# Patient Record
Sex: Female | Born: 1945 | Race: Asian | Hispanic: No | Marital: Married | Smoking: Never smoker
Health system: Southern US, Community
[De-identification: ages and names within clinical notes are randomized; demographics above are authoritative.]

## PROBLEM LIST (undated history)

## (undated) DIAGNOSIS — I10 Essential (primary) hypertension: Secondary | ICD-10-CM

## (undated) DIAGNOSIS — E7800 Pure hypercholesterolemia, unspecified: Secondary | ICD-10-CM

## (undated) DIAGNOSIS — I2510 Atherosclerotic heart disease of native coronary artery without angina pectoris: Secondary | ICD-10-CM

## (undated) DIAGNOSIS — K219 Gastro-esophageal reflux disease without esophagitis: Secondary | ICD-10-CM

## (undated) HISTORY — PX: GALLBLADDER SURGERY: SHX652

## (undated) HISTORY — DX: Atherosclerotic heart disease of native coronary artery without angina pectoris: I25.10

---

## 1998-12-14 ENCOUNTER — Encounter: Admission: RE | Admit: 1998-12-14 | Discharge: 1998-12-14 | Payer: Self-pay | Admitting: Family Medicine

## 2000-05-13 ENCOUNTER — Other Ambulatory Visit: Admission: RE | Admit: 2000-05-13 | Discharge: 2000-05-13 | Payer: Self-pay | Admitting: Family Medicine

## 2000-06-02 ENCOUNTER — Encounter: Admission: RE | Admit: 2000-06-02 | Discharge: 2000-06-02 | Payer: Self-pay | Admitting: Family Medicine

## 2000-06-02 ENCOUNTER — Encounter: Payer: Self-pay | Admitting: Family Medicine

## 2001-03-09 ENCOUNTER — Encounter: Payer: Self-pay | Admitting: Family Medicine

## 2001-03-09 ENCOUNTER — Encounter: Admission: RE | Admit: 2001-03-09 | Discharge: 2001-03-09 | Payer: Self-pay | Admitting: Family Medicine

## 2001-08-26 ENCOUNTER — Ambulatory Visit (HOSPITAL_COMMUNITY): Admission: RE | Admit: 2001-08-26 | Discharge: 2001-08-26 | Payer: Self-pay | Admitting: Family Medicine

## 2001-08-26 ENCOUNTER — Encounter: Payer: Self-pay | Admitting: Family Medicine

## 2001-09-01 ENCOUNTER — Other Ambulatory Visit: Admission: RE | Admit: 2001-09-01 | Discharge: 2001-09-01 | Payer: Self-pay | Admitting: Family Medicine

## 2001-09-07 ENCOUNTER — Encounter: Payer: Self-pay | Admitting: Family Medicine

## 2001-09-07 ENCOUNTER — Encounter: Admission: RE | Admit: 2001-09-07 | Discharge: 2001-09-07 | Payer: Self-pay | Admitting: Family Medicine

## 2001-09-18 ENCOUNTER — Encounter (INDEPENDENT_AMBULATORY_CARE_PROVIDER_SITE_OTHER): Payer: Self-pay | Admitting: Specialist

## 2001-09-18 ENCOUNTER — Encounter: Payer: Self-pay | Admitting: Surgery

## 2001-09-18 ENCOUNTER — Observation Stay (HOSPITAL_COMMUNITY): Admission: RE | Admit: 2001-09-18 | Discharge: 2001-09-19 | Payer: Self-pay | Admitting: Surgery

## 2002-09-09 ENCOUNTER — Encounter: Admission: RE | Admit: 2002-09-09 | Discharge: 2002-09-09 | Payer: Self-pay | Admitting: Family Medicine

## 2002-09-09 ENCOUNTER — Other Ambulatory Visit: Admission: RE | Admit: 2002-09-09 | Discharge: 2002-09-09 | Payer: Self-pay | Admitting: Family Medicine

## 2002-09-09 ENCOUNTER — Encounter: Payer: Self-pay | Admitting: Family Medicine

## 2003-11-04 ENCOUNTER — Other Ambulatory Visit: Admission: RE | Admit: 2003-11-04 | Discharge: 2003-11-04 | Payer: Self-pay | Admitting: Family Medicine

## 2003-11-18 ENCOUNTER — Encounter: Admission: RE | Admit: 2003-11-18 | Discharge: 2003-11-18 | Payer: Self-pay | Admitting: Family Medicine

## 2005-09-03 ENCOUNTER — Ambulatory Visit: Payer: Self-pay | Admitting: Family Medicine

## 2005-09-05 ENCOUNTER — Ambulatory Visit (HOSPITAL_COMMUNITY): Admission: RE | Admit: 2005-09-05 | Discharge: 2005-09-05 | Payer: Self-pay | Admitting: Family Medicine

## 2005-10-07 ENCOUNTER — Ambulatory Visit: Payer: Self-pay | Admitting: *Deleted

## 2005-10-10 ENCOUNTER — Ambulatory Visit: Payer: Self-pay | Admitting: Family Medicine

## 2005-10-11 ENCOUNTER — Encounter (INDEPENDENT_AMBULATORY_CARE_PROVIDER_SITE_OTHER): Payer: Self-pay | Admitting: Family Medicine

## 2005-10-11 LAB — CONVERTED CEMR LAB: TSH: 1.951 microintl units/mL

## 2005-11-05 ENCOUNTER — Ambulatory Visit: Payer: Self-pay | Admitting: Family Medicine

## 2005-11-11 ENCOUNTER — Ambulatory Visit: Payer: Self-pay | Admitting: Family Medicine

## 2005-11-15 ENCOUNTER — Ambulatory Visit (HOSPITAL_COMMUNITY): Admission: RE | Admit: 2005-11-15 | Discharge: 2005-11-15 | Payer: Self-pay | Admitting: Family Medicine

## 2005-12-03 ENCOUNTER — Ambulatory Visit: Payer: Self-pay | Admitting: Family Medicine

## 2005-12-11 ENCOUNTER — Ambulatory Visit (HOSPITAL_COMMUNITY): Admission: RE | Admit: 2005-12-11 | Discharge: 2005-12-11 | Payer: Self-pay | Admitting: Family Medicine

## 2005-12-24 ENCOUNTER — Ambulatory Visit: Payer: Self-pay | Admitting: Family Medicine

## 2006-01-06 ENCOUNTER — Ambulatory Visit: Payer: Self-pay | Admitting: Family Medicine

## 2006-02-03 ENCOUNTER — Encounter (INDEPENDENT_AMBULATORY_CARE_PROVIDER_SITE_OTHER): Payer: Self-pay | Admitting: Family Medicine

## 2006-02-03 ENCOUNTER — Ambulatory Visit: Payer: Self-pay | Admitting: Family Medicine

## 2006-02-04 ENCOUNTER — Encounter (INDEPENDENT_AMBULATORY_CARE_PROVIDER_SITE_OTHER): Payer: Self-pay | Admitting: Family Medicine

## 2006-03-18 ENCOUNTER — Ambulatory Visit: Payer: Self-pay | Admitting: Family Medicine

## 2006-10-21 ENCOUNTER — Encounter (INDEPENDENT_AMBULATORY_CARE_PROVIDER_SITE_OTHER): Payer: Self-pay | Admitting: Family Medicine

## 2006-10-21 DIAGNOSIS — K649 Unspecified hemorrhoids: Secondary | ICD-10-CM | POA: Insufficient documentation

## 2006-10-21 DIAGNOSIS — I10 Essential (primary) hypertension: Secondary | ICD-10-CM | POA: Insufficient documentation

## 2006-10-21 DIAGNOSIS — N951 Menopausal and female climacteric states: Secondary | ICD-10-CM | POA: Insufficient documentation

## 2006-10-21 DIAGNOSIS — E7800 Pure hypercholesterolemia, unspecified: Secondary | ICD-10-CM | POA: Insufficient documentation

## 2006-10-22 DIAGNOSIS — K5730 Diverticulosis of large intestine without perforation or abscess without bleeding: Secondary | ICD-10-CM | POA: Insufficient documentation

## 2006-11-19 ENCOUNTER — Encounter (INDEPENDENT_AMBULATORY_CARE_PROVIDER_SITE_OTHER): Payer: Self-pay | Admitting: *Deleted

## 2008-02-19 ENCOUNTER — Ambulatory Visit: Payer: Self-pay | Admitting: Family Medicine

## 2008-02-19 LAB — CONVERTED CEMR LAB
ALT: 20 units/L (ref 0–35)
Albumin: 4.7 g/dL (ref 3.5–5.2)
Basophils Relative: 1 % (ref 0–1)
Hemoglobin: 14.4 g/dL (ref 12.0–15.0)
LDL Cholesterol: 143 mg/dL — ABNORMAL HIGH (ref 0–99)
Lymphocytes Relative: 32 % (ref 12–46)
Monocytes Absolute: 0.3 10*3/uL (ref 0.1–1.0)
Neutro Abs: 2.5 10*3/uL (ref 1.7–7.7)
Neutrophils Relative %: 58 % (ref 43–77)
Platelets: 229 10*3/uL (ref 150–400)
Potassium: 4.2 meq/L (ref 3.5–5.3)
RDW: 12.4 % (ref 11.5–15.5)
Sodium: 144 meq/L (ref 135–145)
TSH: 3.6 microintl units/mL (ref 0.350–4.50)
WBC: 4.3 10*3/uL (ref 4.0–10.5)

## 2008-03-03 ENCOUNTER — Ambulatory Visit (HOSPITAL_COMMUNITY): Admission: RE | Admit: 2008-03-03 | Discharge: 2008-03-03 | Payer: Self-pay | Admitting: Family Medicine

## 2008-03-11 ENCOUNTER — Ambulatory Visit (HOSPITAL_COMMUNITY): Admission: RE | Admit: 2008-03-11 | Discharge: 2008-03-11 | Payer: Self-pay | Admitting: Family Medicine

## 2008-09-09 ENCOUNTER — Ambulatory Visit: Payer: Self-pay | Admitting: Family Medicine

## 2008-09-13 ENCOUNTER — Ambulatory Visit: Payer: Self-pay | Admitting: Internal Medicine

## 2008-09-14 ENCOUNTER — Ambulatory Visit: Payer: Self-pay | Admitting: Internal Medicine

## 2008-09-14 ENCOUNTER — Ambulatory Visit (HOSPITAL_COMMUNITY): Admission: RE | Admit: 2008-09-14 | Discharge: 2008-09-14 | Payer: Self-pay | Admitting: Family Medicine

## 2008-10-07 ENCOUNTER — Ambulatory Visit: Payer: Self-pay | Admitting: Internal Medicine

## 2008-11-08 ENCOUNTER — Ambulatory Visit: Payer: Self-pay | Admitting: Family Medicine

## 2009-01-03 ENCOUNTER — Ambulatory Visit: Payer: Self-pay | Admitting: Family Medicine

## 2009-01-03 LAB — CONVERTED CEMR LAB
ALT: 18 units/L (ref 0–35)
Albumin: 4.5 g/dL (ref 3.5–5.2)
Alkaline Phosphatase: 77 units/L (ref 39–117)
CO2: 24 meq/L (ref 19–32)
Calcium: 9.3 mg/dL (ref 8.4–10.5)
Cholesterol: 245 mg/dL — ABNORMAL HIGH (ref 0–200)
Creatinine, Ser: 0.81 mg/dL (ref 0.40–1.20)
Glucose, Bld: 94 mg/dL (ref 70–99)
HDL: 77 mg/dL (ref >39)
Sodium: 142 meq/L (ref 135–145)
Total CHOL/HDL Ratio: 3.2
Total Protein: 7.2 g/dL (ref 6.0–8.3)
Triglycerides: 128 mg/dL (ref <150)
VLDL: 26 mg/dL (ref 0–40)

## 2009-08-08 ENCOUNTER — Ambulatory Visit: Payer: Self-pay | Admitting: Family Medicine

## 2009-08-08 LAB — CONVERTED CEMR LAB
ALT: 16 units/L (ref 0–35)
Cholesterol: 242 mg/dL — ABNORMAL HIGH (ref 0–200)
HDL: 71 mg/dL (ref >39)
LDL Cholesterol: 147 mg/dL — ABNORMAL HIGH (ref 0–99)
Total CHOL/HDL Ratio: 3.4
Triglycerides: 118 mg/dL (ref <150)

## 2009-08-17 ENCOUNTER — Ambulatory Visit (HOSPITAL_COMMUNITY): Admission: RE | Admit: 2009-08-17 | Discharge: 2009-08-17 | Payer: Self-pay | Admitting: Family Medicine

## 2009-10-31 ENCOUNTER — Ambulatory Visit: Payer: Self-pay | Admitting: Family Medicine

## 2009-11-14 ENCOUNTER — Encounter (INDEPENDENT_AMBULATORY_CARE_PROVIDER_SITE_OTHER): Payer: Self-pay | Admitting: Family Medicine

## 2009-11-14 ENCOUNTER — Ambulatory Visit: Payer: Self-pay | Admitting: Internal Medicine

## 2009-11-14 LAB — CONVERTED CEMR LAB
BUN: 13 mg/dL (ref 6–23)
Chloride: 103 meq/L (ref 96–112)
Potassium: 4.9 meq/L (ref 3.5–5.3)
Sodium: 142 meq/L (ref 135–145)

## 2009-11-17 ENCOUNTER — Ambulatory Visit (HOSPITAL_COMMUNITY): Admission: RE | Admit: 2009-11-17 | Discharge: 2009-11-17 | Payer: Self-pay | Admitting: Family Medicine

## 2009-12-14 ENCOUNTER — Encounter (INDEPENDENT_AMBULATORY_CARE_PROVIDER_SITE_OTHER): Payer: Self-pay | Admitting: Family Medicine

## 2009-12-14 ENCOUNTER — Ambulatory Visit: Payer: Self-pay | Admitting: Internal Medicine

## 2009-12-14 LAB — CONVERTED CEMR LAB
Cholesterol: 182 mg/dL (ref 0–200)
Creatinine, Ser: 0.86 mg/dL (ref 0.40–1.20)
Potassium: 4.4 meq/L (ref 3.5–5.3)

## 2010-01-29 ENCOUNTER — Emergency Department (HOSPITAL_COMMUNITY): Admission: EM | Admit: 2010-01-29 | Discharge: 2010-01-29 | Payer: Self-pay | Admitting: Family Medicine

## 2010-07-20 NOTE — Op Note (Signed)
[facility]  Patient:    Linda Hughes, Linda Hughes [facility] Visit Number: [id number] MRN: [id number]          Service Type: SUR Location: [id number] Attending Physician:  Charlton Haws Dictated by:   Currie Paris, M.D. Proc. Date: 09/18/01 Admit Date:  09/18/2001 Discharge Date: 09/19/2001   CC:         Fayrene Fearing D. Artis Flock, M.D.   Operative Report  CCS# [id number]  PREOPERATIVE DIAGNOSIS:  Chronic calculus cholecystitis.  POSTOPERATIVE DIAGNOSIS:  Chronic calculus cholecystitis.  OPERATION PERFORMED:  Laparoscopic cholecystectomy with intraoperative cholangiogram.  SURGEON:  Currie Paris, M.D.  ASSISTANT:  Donnie Coffin. Samuella Cota, M.D.  ANESTHESIA:  General endotracheal.  INDICATIONS FOR PROCEDURE:  This patient is a 65 year old with some biliary symptoms and a large stone seen on ultrasound.  DESCRIPTION OF PROCEDURE:  The patient was seen in the holding area and had no further questions.  She was taken to the operating room and after satisfactory general endotracheal anesthesia had been obtained, the abdomen was prepped and draped.  0.25% plain Marcaine was used for each incision.  The umbilical incision was made first, the fascia opened and the peritoneal cavity entered under direct vision.  A pursestring suture was placed and the Hasson cannula introduced and the abdomen insufflated to 15.  The patient was placed in reversed Trendelenburg position and three additional cannulas were placed in the usual positions with a 10 mm in the epigastrium and two 5 mm laterally.  The gallbladder was noted to be fairly distended but not acutely inflamed.  There were no other inflammatory processes or gross abnormalities noted on exam of the abdomen.  The gallbladder was retracted over the liver and the peritoneum over the cystic duct opened and the cystic duct dissected out and I could see it at its junction with the gallbladder and common duct well.  I  could see the cystic artery a little higher up and opened the window posteriorly so I could get a good view of the triangle of Calot.  Once the anatomy was confirmed, I put a clip on the cystic duct just at its junction with the gallbladder and opened it.  A Reddick catheter was used and placed in the cystic duct and operative cholangiography done.  The common duct emptied promptly but looked a little bit narrowed and I wasnt sure there was not a little stricture distally but there was no filling defects.  The proximal radicals filled nicely.  The cystic duct catheter was removed and three clips placed on the stay side of the cystic duct and it was divided.  The branches of the cystic artery were dissected out and clipped and divided and the gallbladder then removed from below to above.  Just prior to disconnecting the gallbladder we made sure everything was dry, did a final irrigation.  The gallbladder was then removed and put in an endocatch bag and brought out the umbilical port.  The umbilical port was occluded temporarily and the abdomen reinsufflated and a final inspection check for hemostasis made.  Everything again appeared dry, so the lateral ports removed.  There was no bleeding at those.  An umbilical port was closed using the camera to make sure we did not catch anything in that closure.  The abdomen was deflated through the epigastric port.  The skin was closed with 4-0 Monocryl subcuticular plus Steri-Strips.  The patient tolerated the procedure well.  The patient tolerated the procedure well.  There  were no operative complications.  All counts were correct. Dictated by:   Currie Paris, M.D. Attending Physician:  Charlton Haws DD:  09/18/01 TD:  09/22/01 Job: [id number]

## 2010-11-15 ENCOUNTER — Other Ambulatory Visit (HOSPITAL_COMMUNITY): Payer: Self-pay | Admitting: Family Medicine

## 2010-11-15 DIAGNOSIS — Z1231 Encounter for screening mammogram for malignant neoplasm of breast: Secondary | ICD-10-CM

## 2010-11-26 ENCOUNTER — Ambulatory Visit (HOSPITAL_COMMUNITY)
Admission: RE | Admit: 2010-11-26 | Discharge: 2010-11-26 | Disposition: A | Discharge status: Home / Self Care | Payer: Self-pay | Source: Ambulatory Visit | Attending: Family Medicine | Admitting: Family Medicine

## 2010-11-26 DIAGNOSIS — Z1231 Encounter for screening mammogram for malignant neoplasm of breast: Secondary | ICD-10-CM | POA: Insufficient documentation

## 2012-01-07 ENCOUNTER — Encounter (HOSPITAL_COMMUNITY): Payer: Self-pay | Admitting: Emergency Medicine

## 2012-01-07 ENCOUNTER — Emergency Department (INDEPENDENT_AMBULATORY_CARE_PROVIDER_SITE_OTHER)
Admission: EM | Admit: 2012-01-07 | Discharge: 2012-01-07 | Disposition: A | Discharge status: Home / Self Care | Payer: Self-pay | Source: Home / Self Care

## 2012-01-07 DIAGNOSIS — I10 Essential (primary) hypertension: Secondary | ICD-10-CM

## 2012-01-07 DIAGNOSIS — E785 Hyperlipidemia, unspecified: Secondary | ICD-10-CM

## 2012-01-07 DIAGNOSIS — F329 Major depressive disorder, single episode, unspecified: Secondary | ICD-10-CM

## 2012-01-07 HISTORY — DX: Essential (primary) hypertension: I10

## 2012-01-07 HISTORY — DX: Pure hypercholesterolemia, unspecified: E78.00

## 2012-01-07 MED ORDER — PRAVASTATIN SODIUM 40 MG PO TABS: 40.0000 mg | ORAL_TABLET | Freq: Every day | ORAL | Status: DC

## 2012-01-07 MED ORDER — METOPROLOL SUCCINATE ER 25 MG PO TB24: 50.0000 mg | ORAL_TABLET | Freq: Every day | ORAL | Status: DC

## 2012-01-07 MED ORDER — METOPROLOL SUCCINATE ER 25 MG PO TB24: ORAL_TABLET | ORAL | Status: DC

## 2012-01-07 MED ORDER — CITALOPRAM HYDROBROMIDE 40 MG PO TABS: 40.0000 mg | ORAL_TABLET | Freq: Every day | ORAL | Status: DC

## 2012-01-07 NOTE — ED Notes (Signed)
Pt is here to have meds refilled.... Was going to [facility] but due to their closure has not been able to see PCP... Sx include: dizziness and will occasional c/o headaches, blurry vision, SOB, and chest discomfort.... Denies: fevers, vomiting, nausea, diarrhea... Pt is alert w/no signs of distress.

## 2012-01-07 NOTE — ED Provider Notes (Signed)
History     CSN: [id number]  Arrival date & time 01/07/12  1349   None     Chief Complaint  Patient presents with  . Medication Refill    (Consider location/radiation/quality/duration/timing/severity/associated sxs/prior treatment) HPI Comments: 66 year old Asian female former patient of [facility] presents for refills of her medications. She denies having any acute problems now. Occasionally she will feel dizzy early in the morning. She is primarily here to have  her 3 medications of Celexa, pravastatin and Toprol XL 50 mg.   Past Medical History  Diagnosis Date  . Hypertension   . High blood cholesterol     Past Surgical History  Procedure Date  . Gallbladder surgery     No family history on file.  History  Substance Use Topics  . Smoking status: Never Smoker   . Smokeless tobacco: Not on file  . Alcohol Use: No    OB History    Grav Para Term Preterm Abortions TAB SAB Ect Mult Living                  Review of Systems  Constitutional: Negative for fever, activity change and fatigue.  HENT: Negative.   Respiratory: Negative for cough, shortness of breath and wheezing.   Cardiovascular: Negative for chest pain and palpitations.  Gastrointestinal: Negative.   Genitourinary: Negative.   Musculoskeletal: Negative.   Skin: Negative for color change, pallor and rash.  Neurological: Negative.     Allergies  Review of patient's allergies indicates no known allergies.  Home Medications   Current Outpatient Rx  Name  Route  Sig  Dispense  Refill  . CITALOPRAM HYDROBROMIDE 40 MG PO TABS   Oral   Take 40 mg by mouth daily.         Marland Kitchen CITALOPRAM HYDROBROMIDE 40 MG PO TABS   Oral   Take 1 tablet (40 mg total) by mouth daily.   30 tablet   0   . METOPROLOL SUCCINATE ER 25 MG PO TB24      One tab daily   30 tablet   0   . PRAVASTATIN SODIUM 40 MG PO TABS   Oral   Take 40 mg by mouth daily.         Marland Kitchen PRAVASTATIN SODIUM 40 MG PO TABS   Oral  Take 1 tablet (40 mg total) by mouth daily.   30 tablet   0     BP 133/82  Pulse 64  Temp 98.1 F (36.7 C) (Oral)  Resp 20  SpO2 96%  Physical Exam  Constitutional: She is oriented to person, place, and time. She appears well-developed and well-nourished. No distress.  HENT:  Head: Normocephalic and atraumatic.  Mouth/Throat: Oropharynx is clear and moist. No oropharyngeal exudate.  Eyes: EOM are normal. Pupils are equal, round, and reactive to light.  Neck: Normal range of motion. Neck supple.  Cardiovascular: Normal rate and normal heart sounds.   Pulmonary/Chest: Effort normal and breath sounds normal. No respiratory distress.  Abdominal: Soft. There is no tenderness.  Musculoskeletal: Normal range of motion.  Neurological: She is alert and oriented to person, place, and time. No cranial nerve deficit.  Skin: Skin is warm and dry.  Psychiatric: She has a normal mood and affect.    ED Course  Procedures (including critical care time)  Labs Reviewed - No data to display No results found.   1. HTN (hypertension)   2. Dyslipidemia   3. Depression  MDM  Celexa 40 mg one daily Toprol-XL 50 mg one daily Pravastatin 40 mg one each bedtime Continue to try tonic PCP for your primary care.         Hayden Rasmussen, NP 01/07/12 [phone]

## 2012-01-10 NOTE — ED Provider Notes (Signed)
Medical screening examination/treatment/procedure(s) were performed by resident physician or non-physician practitioner and as supervising physician I was immediately available for consultation/collaboration.   Barkley Bruns MD.    Linna Hoff, MD 01/10/12 [phone]

## 2012-02-27 ENCOUNTER — Encounter (HOSPITAL_COMMUNITY): Payer: Self-pay

## 2012-02-27 ENCOUNTER — Emergency Department (HOSPITAL_COMMUNITY)
Admission: EM | Admit: 2012-02-27 | Discharge: 2012-02-27 | Disposition: A | Discharge status: Home / Self Care | Payer: Self-pay | Source: Home / Self Care

## 2012-02-27 DIAGNOSIS — F329 Major depressive disorder, single episode, unspecified: Secondary | ICD-10-CM

## 2012-02-27 DIAGNOSIS — I10 Essential (primary) hypertension: Secondary | ICD-10-CM

## 2012-02-27 DIAGNOSIS — E785 Hyperlipidemia, unspecified: Secondary | ICD-10-CM

## 2012-02-27 MED ORDER — METOPROLOL SUCCINATE ER 50 MG PO TB24: 50.0000 mg | ORAL_TABLET | Freq: Every day | ORAL | Status: DC

## 2012-02-27 MED ORDER — CITALOPRAM HYDROBROMIDE 40 MG PO TABS: 40.0000 mg | ORAL_TABLET | Freq: Every day | ORAL | Status: DC

## 2012-02-27 MED ORDER — PRAVASTATIN SODIUM 40 MG PO TABS: 40.0000 mg | ORAL_TABLET | Freq: Every day | ORAL | Status: DC

## 2012-02-27 NOTE — ED Notes (Signed)
Medication refill- history of high cholesterol

## 2012-02-27 NOTE — ED Provider Notes (Signed)
History     CSN: [id number]  Arrival date & time 02/27/12  1120   First MD Initiated Contact with Patient 02/27/12 1316      Chief Complaint  Patient presents with  . Medication Refill    (Consider location/radiation/quality/duration/timing/severity/associated sxs/prior treatment) HPI 66 year old female who comes to clinic today for medication refill. Patient has a history of hypertension and depression and hyperlipidemia. She also wants to get her fasting lipid profile. She also wants to get a mammogram checked as she has a family history of breast cancer. Her previous mammogram in September 2012 was normal Past Medical History  Diagnosis Date  . Hypertension   . High blood cholesterol     Past Surgical History  Procedure Date  . Gallbladder surgery     Positive family history of breast cancer  History  Substance Use Topics  . Smoking status: Never Smoker   . Smokeless tobacco: Not on file  . Alcohol Use: No    OB History    Grav Para Term Preterm Abortions TAB SAB Ect Mult Living                  Review of Systems Review of Systems:  HEENT: Denies headache, blurred vision, runny nose, sore throat,  Chest : Denies shortness of breath, no history of COPD Heart : Denies Chest pain,  coronary arterey disease      Allergies  Review of patient's allergies indicates no known allergies.  Home Medications   Current Outpatient Rx  Name  Route  Sig  Dispense  Refill  . CITALOPRAM HYDROBROMIDE 40 MG PO TABS   Oral   Take 40 mg by mouth daily.         Marland Kitchen CITALOPRAM HYDROBROMIDE 40 MG PO TABS   Oral   Take 1 tablet (40 mg total) by mouth daily.   30 tablet   3   . METOPROLOL SUCCINATE ER 50 MG PO TB24   Oral   Take 1 tablet (50 mg total) by mouth daily. Take with or immediately following a meal.   30 tablet   3   . PRAVASTATIN SODIUM 40 MG PO TABS   Oral   Take 1 tablet (40 mg total) by mouth daily.   30 tablet   0   . PRAVASTATIN SODIUM 40 MG  PO TABS   Oral   Take 1 tablet (40 mg total) by mouth daily.   30 tablet   3     BP 136/60  Pulse 60  Temp 98.1 F (36.7 C) (Oral)  Resp 20  SpO2 99%  Physical Exam    Constitutional:   Patient is a well-developed and well-nourished female in no acute distress and cooperative with exam. Head: Normocephalic and atraumatic Mouth: Mucus membranes moist Eyes: PERRL, EOMI, conjunctivae normal Neck: Supple, No Thyromegaly Cardiovascular: RRR, S1 normal, S2 normal Pulmonary/Chest: CTAB, no wheezes, rales, or rhonchi Abdominal: Soft. Non-tender, non-distended, bowel sounds are normal, no masses, organomegaly, or guarding present.  Neurological: A&O x3, Strenght is normal and symmetric bilaterally, cranial nerve II-XII are grossly intact, no focal motor deficit, sensory intact to light touch bilaterally.  Extremities : No Cyanosis, Clubbing or Edema  ED Course  Procedures (including critical care time)  Labs Reviewed - No data to display No results found.   1. HTN (hypertension)   2. Hyperlipidemia   3. Depression    Hypertension Patient's blood pressure is stable, she'll be given prescription for Toprol XL 50 mg by  mouth daily  Hyperlipidemia Will give refill for Pravachol 40 mg by mouth daily. Also will set up fasting lipid profile  Depression Will give refills for Celexa 40 mg by mouth daily  Screening #1 lipid profile, fasting #2 and will return mammogram, ordered.  We will followup the results of fasting lipid profile and mammogram in the next visit   MDM          Meredeth Ide, MD 02/27/12 1356

## 2012-02-28 ENCOUNTER — Emergency Department (INDEPENDENT_AMBULATORY_CARE_PROVIDER_SITE_OTHER)
Admission: EM | Admit: 2012-02-28 | Discharge: 2012-02-28 | Disposition: A | Discharge status: Home / Self Care | Payer: Self-pay | Source: Home / Self Care

## 2012-02-28 DIAGNOSIS — E7800 Pure hypercholesterolemia, unspecified: Secondary | ICD-10-CM

## 2012-02-28 DIAGNOSIS — I10 Essential (primary) hypertension: Secondary | ICD-10-CM

## 2012-02-28 LAB — LIPID PANEL: LDL Cholesterol: 115 mg/dL — ABNORMAL HIGH (ref 0–99)

## 2012-02-28 NOTE — ED Notes (Signed)
bloodwork only

## 2012-03-05 ENCOUNTER — Telehealth (HOSPITAL_COMMUNITY): Payer: Self-pay

## 2012-03-05 NOTE — Telephone Encounter (Signed)
Message copied by Lestine Mount on Thu Mar 05, 2012 12:37 PM ------      Message from: Vassie Moselle      Created: Fri Feb 28, 2012  9:48 PM      Regarding: labs                   ----- Message -----         From: [facility] [place] Interface         Sent: 02/28/2012  12:09 PM           To: [facility] Follow Up

## 2012-03-12 ENCOUNTER — Ambulatory Visit (HOSPITAL_COMMUNITY)
Admission: RE | Admit: 2012-03-12 | Discharge: 2012-03-12 | Disposition: A | Discharge status: Home / Self Care | Payer: No Typology Code available for payment source | Source: Ambulatory Visit | Attending: Family Medicine | Admitting: Family Medicine

## 2012-03-12 ENCOUNTER — Other Ambulatory Visit (HOSPITAL_COMMUNITY): Payer: Self-pay | Admitting: Family Medicine

## 2012-03-12 ENCOUNTER — Inpatient Hospital Stay (HOSPITAL_COMMUNITY): Admission: RE | Admit: 2012-03-12 | Payer: Self-pay | Source: Ambulatory Visit

## 2012-03-12 DIAGNOSIS — Z1231 Encounter for screening mammogram for malignant neoplasm of breast: Secondary | ICD-10-CM | POA: Insufficient documentation

## 2012-03-17 ENCOUNTER — Emergency Department (HOSPITAL_COMMUNITY)
Admission: EM | Admit: 2012-03-17 | Discharge: 2012-03-17 | Disposition: A | Discharge status: Home / Self Care | Payer: No Typology Code available for payment source | Source: Home / Self Care

## 2012-03-17 ENCOUNTER — Encounter (HOSPITAL_COMMUNITY): Payer: Self-pay

## 2012-03-17 DIAGNOSIS — I10 Essential (primary) hypertension: Secondary | ICD-10-CM

## 2012-03-17 DIAGNOSIS — E7800 Pure hypercholesterolemia, unspecified: Secondary | ICD-10-CM

## 2012-03-17 MED ORDER — CITALOPRAM HYDROBROMIDE 40 MG PO TABS: 40.0000 mg | ORAL_TABLET | Freq: Every day | ORAL | Status: DC

## 2012-03-17 MED ORDER — METOPROLOL TARTRATE 50 MG PO TABS: 50.0000 mg | ORAL_TABLET | Freq: Two times a day (BID) | ORAL | Status: DC

## 2012-03-17 MED ORDER — PRAVASTATIN SODIUM 40 MG PO TABS: 40.0000 mg | ORAL_TABLET | Freq: Every day | ORAL | Status: DC

## 2012-03-17 NOTE — ED Provider Notes (Signed)
History     CSN: [id number]  Arrival date & time 03/17/12  1003   None    Chief Complaint  Patient presents with  . Follow-up    (Consider location/radiation/quality/duration/timing/severity/associated sxs/prior treatment) HPI Patient presented today for refill of her medication for cholesterol and for hypertension and anxiety.  The patient reports that she would like a 90 day supply of her medications because she's going to be taking a job in [place].  She reports that it would be easier for her to have a 90 day supply of the medication.  She is tolerating it very well.  She reports that she is doing better and having better control of her lipids when she takes 40 mg of pravastatin.  When she does not take the full 40 mg her lipids are not as tightly controlled.  She tolerates the medication very well.  Also she's tolerating her blood pressure medication well but she would like to take the twice daily dose of metoprolol because it's much less expensive.  The patient says she cannot afford the extended release formulation of metoprolol.  Past Medical History  Diagnosis Date  . Hypertension   . High blood cholesterol     Past Surgical History  Procedure Date  . Gallbladder surgery     No family history on file.  History  Substance Use Topics  . Smoking status: Never Smoker   . Smokeless tobacco: Not on file  . Alcohol Use: No    OB History    Grav Para Term Preterm Abortions TAB SAB Ect Mult Living                  Review of Systems  Constitutional: Negative for fever and fatigue.  Respiratory: Negative for chest tightness, shortness of breath and wheezing.   Cardiovascular: Negative for chest pain, palpitations and leg swelling.  All other systems reviewed and are negative.    Allergies  Review of patient's allergies indicates no known allergies.  Home Medications   Current Outpatient Rx  Name  Route  Sig  Dispense  Refill  . CITALOPRAM HYDROBROMIDE 40 MG PO  TABS   Oral   Take 40 mg by mouth daily.         Marland Kitchen CITALOPRAM HYDROBROMIDE 40 MG PO TABS   Oral   Take 1 tablet (40 mg total) by mouth daily.   30 tablet   3   . METOPROLOL SUCCINATE ER 50 MG PO TB24   Oral   Take 1 tablet (50 mg total) by mouth daily. Take with or immediately following a meal.   30 tablet   3   . PRAVASTATIN SODIUM 40 MG PO TABS   Oral   Take 1 tablet (40 mg total) by mouth daily.   30 tablet   0   . PRAVASTATIN SODIUM 40 MG PO TABS   Oral   Take 1 tablet (40 mg total) by mouth daily.   30 tablet   3     BP 116/47  Pulse 68  Temp 98.1 F (36.7 C) (Oral)  Resp 20  SpO2 99%  Physical Exam  Nursing note and vitals reviewed. Constitutional: She is oriented to person, place, and time. She appears well-developed and well-nourished. No distress.  HENT:  Head: Normocephalic and atraumatic.  Eyes: EOM are normal. Pupils are equal, round, and reactive to light.  Neck: Normal range of motion. Neck supple. No JVD present. No thyromegaly present.  Cardiovascular: Normal rate, regular  rhythm and normal heart sounds.   Pulmonary/Chest: Effort normal and breath sounds normal.  Abdominal: Soft. Bowel sounds are normal.  Musculoskeletal: Normal range of motion. She exhibits no edema and no tenderness.  Neurological: She is alert and oriented to person, place, and time.  Skin: Skin is warm and dry. No rash noted. No erythema. No pallor.  Psychiatric: She has a normal mood and affect. Her behavior is normal. Judgment and thought content normal.    ED Course  Procedures (including critical care time)  Labs Reviewed - No data to display No results found.   No diagnosis found.  MDM  IMPRESSION  Hyperlipidemia  Hypertension, controlled  Anxiety/Dep, controlled on meds   RECOMMENDATIONS / PLAN Increase the dose of pravastatin to 40 mg to 1 po daily.   I refilled her BP meds and Citalopram as requested  Recheck 4 months    FOLLOW UP 4 months  for labs  The patient was given clear instructions to go to ER or return to medical center if symptoms don't improve, worsen or new problems develop.  The patient verbalized understanding.  The patient was told to call to get lab results if they haven't heard anything in the next week.            Cleora Fleet, MD 03/17/12 1155

## 2012-03-17 NOTE — ED Notes (Signed)
Follow up hypertension

## 2012-06-29 ENCOUNTER — Emergency Department (HOSPITAL_COMMUNITY)
Admission: EM | Admit: 2012-06-29 | Discharge: 2012-06-29 | Disposition: A | Discharge status: Home / Self Care | Payer: No Typology Code available for payment source | Source: Home / Self Care

## 2012-06-29 ENCOUNTER — Encounter (HOSPITAL_COMMUNITY): Payer: Self-pay | Admitting: *Deleted

## 2012-06-29 DIAGNOSIS — I10 Essential (primary) hypertension: Secondary | ICD-10-CM

## 2012-06-29 DIAGNOSIS — F411 Generalized anxiety disorder: Secondary | ICD-10-CM

## 2012-06-29 DIAGNOSIS — F419 Anxiety disorder, unspecified: Secondary | ICD-10-CM

## 2012-06-29 DIAGNOSIS — E7800 Pure hypercholesterolemia, unspecified: Secondary | ICD-10-CM

## 2012-06-29 MED ORDER — PRAVASTATIN SODIUM 40 MG PO TABS: 40.0000 mg | ORAL_TABLET | Freq: Every day | ORAL | Status: DC

## 2012-06-29 MED ORDER — CITALOPRAM HYDROBROMIDE 40 MG PO TABS: 40.0000 mg | ORAL_TABLET | Freq: Every day | ORAL | Status: DC

## 2012-06-29 MED ORDER — METOPROLOL TARTRATE 50 MG PO TABS: 50.0000 mg | ORAL_TABLET | Freq: Two times a day (BID) | ORAL | Status: DC

## 2012-06-29 NOTE — ED Notes (Signed)
Patient presents for follow up for cholesterolemia and hypertension. Also; refill for medication.

## 2012-06-29 NOTE — ED Provider Notes (Signed)
History     CSN: [id number]  Arrival date & time 06/29/12  3711  67 year old female who presents for a four-month followup and medication refill. The patient has been taking 40 mg of pravastatin. She is requesting a check of her lipid panel. She has no symptoms of myalgias. Blood pressure slightly elevated today. The patient states that this is typical when she is in a doctor's appointment. She denies any chest pain or shortness of breath.    Chief Complaint  Patient presents with  . Follow-up  . Medication Refill    (Consider location/radiation/quality/duration/timing/severity/associated sxs/prior treatment) HPI  Past Medical History  Diagnosis Date  . Hypertension   . High blood cholesterol     Past Surgical History  Procedure Laterality Date  . Gallbladder surgery      No family history on file.  History  Substance Use Topics  . Smoking status: Never Smoker   . Smokeless tobacco: Not on file  . Alcohol Use: No    OB History   Grav Para Term Preterm Abortions TAB SAB Ect Mult Living                  Review of Systems Review of Systems  Constitutional: Negative for fever and fatigue.  Respiratory: Negative for chest tightness, shortness of breath and wheezing.  Cardiovascular: Negative for chest pain, palpitations and leg swelling.  All other systems reviewed and are negative.  Allergies  Review of patient's allergies indicates no known allergies.  Home Medications   Current Outpatient Rx  Name  Route  Sig  Dispense  Refill  . citalopram (CELEXA) 40 MG tablet   Oral   Take 1 tablet (40 mg total) by mouth daily.   90 tablet   1   . citalopram (CELEXA) 40 MG tablet   Oral   Take 1 tablet (40 mg total) by mouth daily.   30 tablet   3   . metoprolol (LOPRESSOR) 50 MG tablet   Oral   Take 1 tablet (50 mg total) by mouth 2 (two) times daily.   180 tablet   2   . pravastatin (PRAVACHOL) 40 MG tablet   Oral   Take 1 tablet (40 mg total) by mouth  daily.   30 tablet   3   . pravastatin (PRAVACHOL) 40 MG tablet   Oral   Take 1 tablet (40 mg total) by mouth daily.   90 tablet   1   . pravastatin (PRAVACHOL) 40 MG tablet   Oral   Take 1 tablet (40 mg total) by mouth daily.   30 tablet   2     BP 146/92  Pulse 59  Temp(Src) 98.2 F (36.8 C) (Oral)  Resp 16  SpO2 98%  Physical Exam HENT:  Head: Normocephalic and atraumatic.  Eyes: EOM are normal. Pupils are equal, round, and reactive to light.  Neck: Normal range of motion. Neck supple. No JVD present. No thyromegaly present.  Cardiovascular: Normal rate, regular rhythm and normal heart sounds.  Pulmonary/Chest: Effort normal and breath sounds normal.  Abdominal: Soft. Bowel sounds are normal.  Musculoskeletal: Normal range of motion. She exhibits no edema and no tenderness.  Neurological: She is alert and oriented to person, place, and time.  Skin: Skin is warm and dry. No rash noted. No erythema. No pallor.  Psychiatric: She has a normal mood and affect. Her behavior is normal. Judgment and thought content normal.   ED Course  Procedures (including critical care  time)  Labs Reviewed - No data to display No results found.   No diagnosis found.    MDM  IMPRESSION  Hyperlipidemia  Hypertension, controlled  Anxiety/Dep, controlled on meds  RECOMMENDATIONS / PLAN  Continue dose of pravastatin to 40 mg to 1 po daily.  I refilled her BP meds , pravastatin and Citalopram as requested  Repeat lipid panel and LFT check tomorrow fasting Recheck 4 months          Richarda Overlie, MD 06/29/12 1035

## 2012-06-30 ENCOUNTER — Emergency Department (INDEPENDENT_AMBULATORY_CARE_PROVIDER_SITE_OTHER)
Admission: EM | Admit: 2012-06-30 | Discharge: 2012-06-30 | Disposition: A | Discharge status: Home / Self Care | Payer: No Typology Code available for payment source | Source: Home / Self Care

## 2012-06-30 DIAGNOSIS — F411 Generalized anxiety disorder: Secondary | ICD-10-CM

## 2012-06-30 DIAGNOSIS — I10 Essential (primary) hypertension: Secondary | ICD-10-CM

## 2012-06-30 DIAGNOSIS — E7800 Pure hypercholesterolemia, unspecified: Secondary | ICD-10-CM

## 2012-06-30 LAB — COMPREHENSIVE METABOLIC PANEL
ALT: 29 U/L (ref 0–35)
AST: 32 U/L (ref 0–37)
Albumin: 3.9 g/dL (ref 3.5–5.2)
CO2: 33 mEq/L — ABNORMAL HIGH (ref 19–32)
Chloride: 101 mEq/L (ref 96–112)
Creatinine, Ser: 0.84 mg/dL (ref 0.50–1.10)
Sodium: 140 mEq/L (ref 135–145)
Total Bilirubin: 0.5 mg/dL (ref 0.3–1.2)

## 2012-06-30 LAB — LIPID PANEL
HDL: 78 mg/dL (ref >39)
LDL Cholesterol: 101 mg/dL — ABNORMAL HIGH (ref 0–99)
Triglycerides: 93 mg/dL (ref <150)
VLDL: 19 mg/dL (ref 0–40)

## 2012-06-30 NOTE — ED Notes (Signed)
Patient here for bloodwork cmet cmp lipid

## 2012-09-28 ENCOUNTER — Ambulatory Visit: Payer: No Typology Code available for payment source | Attending: Family Medicine | Admitting: Family Medicine

## 2012-09-28 VITALS — BP 132/81 | HR 63 | Temp 98.1°F | Resp 18 | Wt 118.8 lb

## 2012-09-28 DIAGNOSIS — K219 Gastro-esophageal reflux disease without esophagitis: Secondary | ICD-10-CM | POA: Insufficient documentation

## 2012-09-28 DIAGNOSIS — E785 Hyperlipidemia, unspecified: Secondary | ICD-10-CM | POA: Insufficient documentation

## 2012-09-28 DIAGNOSIS — F411 Generalized anxiety disorder: Secondary | ICD-10-CM | POA: Insufficient documentation

## 2012-09-28 DIAGNOSIS — I10 Essential (primary) hypertension: Secondary | ICD-10-CM

## 2012-09-28 DIAGNOSIS — Z79899 Other long term (current) drug therapy: Secondary | ICD-10-CM | POA: Insufficient documentation

## 2012-09-28 MED ORDER — ESOMEPRAZOLE MAGNESIUM 20 MG PO CPDR: 20.0000 mg | DELAYED_RELEASE_CAPSULE | Freq: Every day | ORAL | Status: DC

## 2012-09-28 MED ORDER — CITALOPRAM HYDROBROMIDE 40 MG PO TABS: 40.0000 mg | ORAL_TABLET | Freq: Every day | ORAL | Status: DC

## 2012-09-28 MED ORDER — PRAVASTATIN SODIUM 40 MG PO TABS: 40.0000 mg | ORAL_TABLET | Freq: Every day | ORAL | Status: DC

## 2012-09-28 MED ORDER — METOPROLOL TARTRATE 50 MG PO TABS: 50.0000 mg | ORAL_TABLET | Freq: Two times a day (BID) | ORAL | Status: DC

## 2012-09-28 NOTE — Progress Notes (Signed)
Patient here for follow up Check cholesterol

## 2012-09-28 NOTE — Patient Instructions (Addendum)
Hypertension  As your heart beats, it forces blood through your arteries. This force is your blood pressure. If the pressure is too high, it is called hypertension (HTN) or high blood pressure. HTN is dangerous because you may have it and not know it. High blood pressure may mean that your heart has to work harder to pump blood. Your arteries may be narrow or stiff. The extra work puts you at risk for heart disease, stroke, and other problems.   Blood pressure consists of two numbers, a higher number over a lower, 110/72, for example. It is stated as "110 over 72." The ideal is below 120 for the top number (systolic) and under 80 for the bottom (diastolic). Write down your blood pressure today.  You should pay close attention to your blood pressure if you have certain conditions such as:   Heart failure.   Prior heart attack.   Diabetes   Chronic kidney disease.   Prior stroke.   Multiple risk factors for heart disease.  To see if you have HTN, your blood pressure should be measured while you are seated with your arm held at the level of the heart. It should be measured at least twice. A one-time elevated blood pressure reading (especially in the Emergency Department) does not mean that you need treatment. There may be conditions in which the blood pressure is different between your right and left arms. It is important to see your caregiver soon for a recheck.  Most people have essential hypertension which means that there is not a specific cause. This type of high blood pressure may be lowered by changing lifestyle factors such as:   Stress.   Smoking.   Lack of exercise.   Excessive weight.   Drug/tobacco/alcohol use.   Eating less salt.  Most people do not have symptoms from high blood pressure until it has caused damage to the body. Effective treatment can often prevent, delay or reduce that damage.  TREATMENT    When a cause has been identified, treatment for high blood pressure is directed at the cause. There are a large number of medications to treat HTN. These fall into several categories, and your caregiver will help you select the medicines that are best for you. Medications may have side effects. You should review side effects with your caregiver.  If your blood pressure stays high after you have made lifestyle changes or started on medicines,    Your medication(s) may need to be changed.   Other problems may need to be addressed.   Be certain you understand your prescriptions, and know how and when to take your medicine.   Be sure to follow up with your caregiver within the time frame advised (usually within two weeks) to have your blood pressure rechecked and to review your medications.   If you are taking more than one medicine to lower your blood pressure, make sure you know how and at what times they should be taken. Taking two medicines at the same time can result in blood pressure that is too low.  SEEK IMMEDIATE MEDICAL CARE IF:   You develop a severe headache, blurred or changing vision, or confusion.   You have unusual weakness or numbness, or a faint feeling.   You have severe chest or abdominal pain, vomiting, or breathing problems.  MAKE SURE YOU:    Understand these instructions.   Will watch your condition.   Will get help right away if you are not doing well   or get worse.  Document Released: 02/18/2005 Document Revised: 05/13/2011 Document Reviewed: 10/09/2007  ExitCare Patient Information 2014 [employer]  Cholesterol  Cholesterol is a white, waxy, fat-like protein needed by your body in small amounts. The liver makes all the cholesterol you need. It is carried from the liver by the blood through the blood vessels. Deposits (plaque) may build up on blood vessel walls. This makes the arteries narrower and stiffer. Plaque increases the risk for heart attack and stroke.   You cannot feel your cholesterol level even if it is very high. The only way to know is by a blood test to check your lipid (fats) levels. Once you know your cholesterol levels, you should keep a record of the test results. Work with your caregiver to to keep your levels in the desired range.  WHAT THE RESULTS MEAN:   Total cholesterol is a rough measure of all the cholesterol in your blood.   LDL is the so-called bad cholesterol. This is the type that deposits cholesterol in the walls of the arteries. You want this level to be low.   HDL is the good cholesterol because it cleans the arteries and carries the LDL away. You want this level to be high.   Triglycerides are fat that the body can either burn for energy or store. High levels are closely linked to heart disease.  DESIRED LEVELS:   Total cholesterol below 200.   LDL below 100 for people at risk, below 70 for very high risk.   HDL above 50 is good, above 60 is best.   Triglycerides below 150.  HOW TO LOWER YOUR CHOLESTEROL:   Diet.   Choose fish or white meat chicken and turkey, roasted or baked. Limit fatty cuts of red meat, fried foods, and processed meats, such as sausage and lunch meat.   Eat lots of fresh fruits and vegetables. Choose whole grains, beans, pasta, potatoes and cereals.   Use only small amounts of olive, corn or canola oils. Avoid butter, mayonnaise, shortening or palm kernel oils. Avoid foods with trans-fats.   Use skim/nonfat milk and low-fat/nonfat yogurt and cheeses. Avoid whole milk, cream, ice cream, egg yolks and cheeses. Healthy desserts include angel food cake, ginger snaps, animal crackers, hard candy, popsicles, and low-fat/nonfat frozen yogurt. Avoid pastries, cakes, pies and cookies.   Exercise.   A regular program helps decrease LDL and raises HDL.   Helps with weight control.   Do things that increase your activity level like gardening, walking, or taking the stairs.   Medication.    May be prescribed by your caregiver to help lowering cholesterol and the risk for heart disease.   You may need medicine even if your levels are normal if you have several risk factors.  HOME CARE INSTRUCTIONS    Follow your diet and exercise programs as suggested by your caregiver.   Take medications as directed.   Have blood work done when your caregiver feels it is necessary.  MAKE SURE YOU:    Understand these instructions.   Will watch your condition.   Will get help right away if you are not doing well or get worse.  Document Released: 11/13/2000 Document Revised: 05/13/2011 Document Reviewed: 05/06/2007  [employer] Patient Information 2014 [employer].

## 2012-09-28 NOTE — Progress Notes (Signed)
Patient ID: Linda Hughes, female   DOB: Mar 15, 1945, 67 y.o.   MRN: [id number]  CC: Followup  HPI: Patient reports that she's doing much better.  She's tolerating her medications.  She is pleased that her cholesterol levels have improved on the higher dose of pravastatin.  The patient reports that she is back in [place] now from [place].  The patient reports that she has been having some acid reflux symptoms.  She reports that she has a really dry throat and spicy foods exacerbate her acid reflux symptoms.  She's not taking any medication for that at this time.    No Known Allergies Past Medical History  Diagnosis Date  . Hypertension   . High blood cholesterol    Current Outpatient Prescriptions on File Prior to Visit  Medication Sig Dispense Refill  . citalopram (CELEXA) 40 MG tablet Take 1 tablet (40 mg total) by mouth daily.  90 tablet  1  . pravastatin (PRAVACHOL) 40 MG tablet Take 1 tablet (40 mg total) by mouth daily.  30 tablet  3  . pravastatin (PRAVACHOL) 40 MG tablet Take 1 tablet (40 mg total) by mouth daily.  90 tablet  1  . [DISCONTINUED] metoprolol succinate (TOPROL-XL) 50 MG 24 hr tablet Take 1 tablet (50 mg total) by mouth daily. Take with or immediately following a meal.  30 tablet  3  . [DISCONTINUED] propranolol (INDERAL) 40 MG tablet Take 40 mg by mouth 3 (three) times daily.       No current facility-administered medications on file prior to visit.   History reviewed. No pertinent family history. History   Social History  . Marital Status: Married    Spouse Name: N/A    Number of Children: N/A  . Years of Education: N/A   Occupational History  . Not on file.   Social History Main Topics  . Smoking status: Never Smoker   . Smokeless tobacco: Not on file  . Alcohol Use: No  . Drug Use: No  . Sexually Active:    Other Topics Concern  . Not on file   Social History Narrative  . No narrative on file    Review of Systems  Constitutional: Negative  for fever, chills, diaphoresis, activity change, appetite change and fatigue.  HENT: Negative for ear pain, nosebleeds, congestion, facial swelling, rhinorrhea, neck pain, neck stiffness and ear discharge.   Eyes: Negative for pain, discharge, redness, itching and visual disturbance.  Respiratory: Negative for cough, choking, chest tightness, shortness of breath, wheezing and stridor.   Cardiovascular: Negative for chest pain, palpitations and leg swelling.  Gastrointestinal: Negative for abdominal distention.  Genitourinary: Negative for dysuria, urgency, frequency, hematuria, flank pain, decreased urine volume, difficulty urinating and dyspareunia.  Musculoskeletal: Negative for back pain, joint swelling, arthralgias and gait problem.  Neurological: Negative for dizziness, tremors, seizures, syncope, facial asymmetry, speech difficulty, weakness, light-headedness, numbness and headaches.  Hematological: Negative for adenopathy. Does not bruise/bleed easily.  Psychiatric/Behavioral: Negative for hallucinations, behavioral problems, confusion, dysphoric mood, decreased concentration and agitation.    Objective:   Filed Vitals:   09/28/12 0857  BP: 132/81  Pulse: 63  Temp: 98.1 F (36.7 C)  Resp: 18    Physical Exam  Constitutional: Appears well-developed and well-nourished. No distress.  HENT: Normocephalic. External right and left ear normal. Oropharynx is clear and moist.  Eyes: Conjunctivae and EOM are normal. PERRLA, no scleral icterus.  Neck: Normal ROM. Neck supple. No JVD. No tracheal deviation. No thyromegaly.  CVS: RRR, S1/S2 +, no murmurs, no gallops, no carotid bruit.  Pulmonary: Effort and breath sounds normal, no stridor, rhonchi, wheezes, rales.  Abdominal: Soft. BS +,  no distension, tenderness, rebound or guarding.  Musculoskeletal: Normal range of motion. No edema and no tenderness.  Lymphadenopathy: No lymphadenopathy noted, cervical, inguinal. Neuro: Alert. Normal  reflexes, muscle tone coordination. No cranial nerve deficit. Skin: Skin is warm and dry. No rash noted. Not diaphoretic. No erythema. No pallor.  Psychiatric: Normal mood and affect. Behavior, judgment, thought content normal.   Lab Results  Component Value Date   WBC 4.3 02/19/2008   HGB 14.4 02/19/2008   HCT 45.2 02/19/2008   MCV 97.0 02/19/2008   PLT 229 02/19/2008   Lab Results  Component Value Date   CREATININE 0.84 06/30/2012   BUN 15 06/30/2012   NA 140 06/30/2012   K 4.3 06/30/2012   CL 101 06/30/2012   CO2 33* 06/30/2012    No results found for this basename: HGBA1C   Lipid Panel     Component Value Date/Time   CHOL 198 06/30/2012 1034   TRIG 93 06/30/2012 1034   HDL 78 06/30/2012 1034   CHOLHDL 2.5 06/30/2012 1034   VLDL 19 06/30/2012 1034   LDLCALC 101* 06/30/2012 1034       Assessment and plan:   Patient Active Problem List   Diagnosis Date Noted  . Dyslipidemia 09/28/2012  . Generalized anxiety disorder 09/28/2012  . GERD (gastroesophageal reflux disease) 09/28/2012  . DIVERTICULAR DISEASE 10/22/2006  . HYPERCHOLESTEROLEMIA 10/21/2006  . HYPERTENSION 10/21/2006  . HEMORRHOIDS 10/21/2006  . MENOPAUSAL SYNDROME 10/21/2006   Unspecified essential hypertension - Plan: CBC, COMPLETE METABOLIC PANEL WITH GFR, Lipid panel, TSH, POCT glycosylated hemoglobin (Hb A1C)  Dyslipidemia - Plan: CBC, COMPLETE METABOLIC PANEL WITH GFR, Lipid panel, TSH, POCT glycosylated hemoglobin (Hb A1C)  Generalized anxiety disorder - Plan: CBC, COMPLETE METABOLIC PANEL WITH GFR, Lipid panel, TSH, POCT glycosylated hemoglobin (Hb A1C)  GERD (gastroesophageal reflux disease) - Plan: CBC, COMPLETE METABOLIC PANEL WITH GFR, Lipid panel, TSH, POCT glycosylated hemoglobin (Hb A1C)  Refill metoprolol 50 mg by mouth twice a day Refills Celexa 40 mg by mouth daily Refill pravastatin 40 mg by mouth daily Trial of Nexium 20 mg by mouth daily for acid reflux  Follow lab results  The patient  was given clear instructions to go to ER or return to medical center if symptoms don't improve, worsen or new problems develop.  The patient verbalized understanding.  The patient was told to call to get any lab results if not heard anything in the next week.    RTC in 4 months  Rodney Langton, MD, CDE, FAAFP [facility] [place], [place]

## 2012-09-29 ENCOUNTER — Telehealth: Payer: Self-pay

## 2012-09-29 LAB — COMPLETE METABOLIC PANEL WITH GFR
ALT: 41 U/L — ABNORMAL HIGH (ref 0–35)
AST: 34 U/L (ref 0–37)
Albumin: 4.3 g/dL (ref 3.5–5.2)
Alkaline Phosphatase: 66 U/L (ref 39–117)
Chloride: 103 mEq/L (ref 96–112)
Potassium: 4.1 mEq/L (ref 3.5–5.3)
Sodium: 141 mEq/L (ref 135–145)
Total Protein: 7.1 g/dL (ref 6.0–8.3)

## 2012-09-29 LAB — LIPID PANEL
LDL Cholesterol: 82 mg/dL (ref 0–99)
Total CHOL/HDL Ratio: 2.5 Ratio
VLDL: 22 mg/dL (ref 0–40)

## 2012-09-29 LAB — CBC
MCHC: 32.7 g/dL (ref 30.0–36.0)
RDW: 13.1 % (ref 11.5–15.5)
WBC: 5.4 10*3/uL (ref 4.0–10.5)

## 2012-09-29 LAB — TSH: TSH: 4.828 u[IU]/mL — ABNORMAL HIGH (ref 0.350–4.500)

## 2012-09-29 NOTE — Telephone Encounter (Signed)
Health department does not have nexium Per Dr Thedore Mins ok to change to omeprazole

## 2012-09-29 NOTE — Progress Notes (Signed)
Quick Note:  Please inform patient that labs came back OK except that TSH level was mildly elevated and one of the liver enzymes was mildly elevated. Recommend rechecking TSH level in 1 month. Also recheck CMP in 1 month.   Rodney Langton, MD, CDE, FAAFP [facility] [place], [place]   ______

## 2012-09-29 NOTE — Telephone Encounter (Signed)
09/29/12 Unable to reach patient at number provided. P.Twanisha Foulk,RN BSN  [facility]

## 2012-10-07 ENCOUNTER — Other Ambulatory Visit: Payer: Self-pay

## 2012-12-14 ENCOUNTER — Ambulatory Visit: Payer: No Typology Code available for payment source | Attending: Internal Medicine | Admitting: Internal Medicine

## 2012-12-14 ENCOUNTER — Encounter: Payer: Self-pay | Admitting: Internal Medicine

## 2012-12-14 VITALS — BP 170/90 | HR 71 | Temp 97.7°F | Resp 16 | Ht 60.63 in | Wt 119.0 lb

## 2012-12-14 DIAGNOSIS — K219 Gastro-esophageal reflux disease without esophagitis: Secondary | ICD-10-CM

## 2012-12-14 DIAGNOSIS — R1013 Epigastric pain: Secondary | ICD-10-CM

## 2012-12-14 DIAGNOSIS — I10 Essential (primary) hypertension: Secondary | ICD-10-CM | POA: Insufficient documentation

## 2012-12-14 DIAGNOSIS — K3189 Other diseases of stomach and duodenum: Secondary | ICD-10-CM

## 2012-12-14 LAB — BASIC METABOLIC PANEL
Calcium: 9.4 mg/dL (ref 8.4–10.5)
Glucose, Bld: 93 mg/dL (ref 70–99)
Sodium: 141 mEq/L (ref 135–145)

## 2012-12-14 LAB — LIPID PANEL
Cholesterol: 184 mg/dL (ref 0–200)
HDL: 67 mg/dL (ref >39)
Total CHOL/HDL Ratio: 2.7 Ratio
Triglycerides: 101 mg/dL (ref <150)

## 2012-12-14 MED ORDER — CITALOPRAM HYDROBROMIDE 40 MG PO TABS: 40.0000 mg | ORAL_TABLET | Freq: Every day | ORAL | Status: DC

## 2012-12-14 MED ORDER — PRAVASTATIN SODIUM 40 MG PO TABS: 40.0000 mg | ORAL_TABLET | Freq: Every day | ORAL | Status: DC

## 2012-12-14 MED ORDER — ESOMEPRAZOLE MAGNESIUM 20 MG PO CPDR: 20.0000 mg | DELAYED_RELEASE_CAPSULE | Freq: Every day | ORAL | Status: DC

## 2012-12-14 MED ORDER — METOPROLOL TARTRATE 50 MG PO TABS: 50.0000 mg | ORAL_TABLET | Freq: Two times a day (BID) | ORAL | Status: DC

## 2012-12-14 NOTE — Patient Instructions (Signed)

## 2012-12-14 NOTE — Progress Notes (Signed)
Pt is here for a F/U visit. Pt is here to get her medications refilled. Pt is also here today for lab work.

## 2012-12-14 NOTE — Progress Notes (Signed)
CC: Followup of, refills for blood pressure medication.  HPI: 67 year old female with past medical history of hypertension, dyslipidemia and depression who presented to clinic for followup. Patient reports having persistent dyspepsia, feeling that food is stuck in her throat. She reports having acid reflux controlled with Nexium. She occasionally has abdominal discomfort after certain foods she is eating such as orange or bread. She reports no nausea or vomiting. No blood in the stool. No diarrhea or constipation.  No Known Allergies Past Medical History  Diagnosis Date  . Hypertension   . High blood cholesterol    Current Outpatient Prescriptions on File Prior to Visit  Medication Sig Dispense Refill  . [DISCONTINUED] metoprolol succinate (TOPROL-XL) 50 MG 24 hr tablet Take 1 tablet (50 mg total) by mouth daily. Take with or immediately following a meal.  30 tablet  3  . [DISCONTINUED] propranolol (INDERAL) 40 MG tablet Take 40 mg by mouth 3 (three) times daily.       No current facility-administered medications on file prior to visit.   Family history significant for hypertension in parents.  History   Social History  . Marital Status: Married    Spouse Name: N/A    Number of Children: N/A  . Years of Education: N/A   Occupational History  . Not on file.   Social History Main Topics  . Smoking status: Never Smoker   . Smokeless tobacco: Not on file  . Alcohol Use: No  . Drug Use: No  . Sexual Activity:    Other Topics Concern  . Not on file   Social History Narrative  . No narrative on file    Review of Systems  Constitutional: Negative for fever, chills, diaphoresis, activity change, appetite change and fatigue.  HENT: Negative for ear pain, nosebleeds, congestion, facial swelling, rhinorrhea, neck pain, neck stiffness and ear discharge.   Eyes: Negative for pain, discharge, redness, itching and visual disturbance.  Respiratory: Negative for cough, choking, chest  tightness, shortness of breath, wheezing and stridor.   Cardiovascular: Negative for chest pain, palpitations and leg swelling.  Gastrointestinal: Negative for abdominal distention.  positive for dyspepsia Genitourinary: Negative for dysuria, urgency, frequency, hematuria, flank pain, decreased urine volume, difficulty urinating and dyspareunia.  Musculoskeletal: Negative for back pain, joint swelling, arthralgias and gait problem.  Neurological: Negative for dizziness, tremors, seizures, syncope, facial asymmetry, speech difficulty, weakness, light-headedness, numbness and headaches.  Hematological: Negative for adenopathy. Does not bruise/bleed easily.  Psychiatric/Behavioral: Negative for hallucinations, behavioral problems, confusion, dysphoric mood, decreased concentration and agitation.    Objective:   Filed Vitals:   12/14/12 0919  BP: 170/90  Pulse: 71  Temp: 97.7 F (36.5 C)  Resp: 16    Physical Exam  Constitutional: Appears well-developed and well-nourished. No distress.  HENT: Normocephalic. External right and left ear normal. Oropharynx is clear and moist.  Eyes: Conjunctivae and EOM are normal. PERRLA, no scleral icterus.  Neck: Normal ROM. Neck supple. No JVD. No tracheal deviation. No thyromegaly.  CVS: RRR, S1/S2 +, no murmurs, no gallops, no carotid bruit.  Pulmonary: Effort and breath sounds normal, no stridor, rhonchi, wheezes, rales.  Abdominal: Soft. BS +,  no distension, tenderness, rebound or guarding.  Musculoskeletal: Normal range of motion. No edema and no tenderness.  Lymphadenopathy: No lymphadenopathy noted, cervical, inguinal. Neuro: Alert. Normal reflexes, muscle tone coordination. No cranial nerve deficit. Skin: Skin is warm and dry. No rash noted. Not diaphoretic. No erythema. No pallor.  Psychiatric: Normal mood and  affect. Behavior, judgment, thought content normal.   Lab Results  Component Value Date   WBC 5.4 09/28/2012   HGB 13.7 09/28/2012    HCT 41.9 09/28/2012   MCV 92.9 09/28/2012   PLT 218 09/28/2012   Lab Results  Component Value Date   CREATININE 0.91 09/28/2012   BUN 11 09/28/2012   NA 141 09/28/2012   K 4.1 09/28/2012   CL 103 09/28/2012   CO2 31 09/28/2012    No results found for this basename: HGBA1C   Lipid Panel     Component Value Date/Time   CHOL 174 09/28/2012 0912   TRIG 110 09/28/2012 0912   HDL 70 09/28/2012 0912   CHOLHDL 2.5 09/28/2012 0912   VLDL 22 09/28/2012 0912   LDLCALC 82 09/28/2012 0912       Assessment and plan:   Patient Active Problem List   Diagnosis Date Noted  . Dyslipidemia 09/28/2012    Priority: Medium - Check lipid panel today. Continue pravastatin   . GERD (gastroesophageal reflux disease) 09/28/2012    Priority: Medium - Referral provided to gastroenterology for dyspepsia  - Continue Nexium   . HYPERTENSION 10/21/2006    Priority: Medium - Patient did not take blood pressure medications this morning.  - We have discussed target BP range - I have advised pt to check BP regularly and to call us back if the numbers are higher than 140/90 - discussed the importance of compliance with medical therapy and diet  - Continue metoprolol

## 2013-01-01 ENCOUNTER — Other Ambulatory Visit: Payer: Self-pay | Admitting: Gastroenterology

## 2013-01-01 NOTE — Addendum Note (Signed)
Addended by: Yeimy Brabant on: 01/01/2013 03:40 PM   Modules accepted: Orders  

## 2013-01-04 ENCOUNTER — Encounter (HOSPITAL_COMMUNITY): Payer: Self-pay | Admitting: Pharmacist

## 2013-01-06 ENCOUNTER — Ambulatory Visit (HOSPITAL_COMMUNITY)
Admission: RE | Admit: 2013-01-06 | Discharge: 2013-01-06 | Disposition: A | Discharge status: Home / Self Care | Payer: No Typology Code available for payment source | Source: Ambulatory Visit | Attending: Gastroenterology | Admitting: Gastroenterology

## 2013-01-06 ENCOUNTER — Encounter (HOSPITAL_COMMUNITY)
Admission: RE | Disposition: A | Discharge status: Home / Self Care | Payer: Self-pay | Source: Ambulatory Visit | Attending: Gastroenterology

## 2013-01-06 ENCOUNTER — Encounter (HOSPITAL_COMMUNITY): Payer: Self-pay

## 2013-01-06 DIAGNOSIS — K5730 Diverticulosis of large intestine without perforation or abscess without bleeding: Secondary | ICD-10-CM | POA: Insufficient documentation

## 2013-01-06 DIAGNOSIS — Z1211 Encounter for screening for malignant neoplasm of colon: Secondary | ICD-10-CM | POA: Insufficient documentation

## 2013-01-06 DIAGNOSIS — E7800 Pure hypercholesterolemia, unspecified: Secondary | ICD-10-CM

## 2013-01-06 DIAGNOSIS — K2960 Other gastritis without bleeding: Secondary | ICD-10-CM | POA: Insufficient documentation

## 2013-01-06 HISTORY — PX: ESOPHAGOGASTRODUODENOSCOPY: SHX5428

## 2013-01-06 HISTORY — DX: Gastro-esophageal reflux disease without esophagitis: K21.9

## 2013-01-06 HISTORY — PX: COLONOSCOPY: SHX5424

## 2013-01-06 SURGERY — EGD (ESOPHAGOGASTRODUODENOSCOPY)
Anesthesia: Moderate Sedation

## 2013-01-06 MED ORDER — MIDAZOLAM HCL 10 MG/2ML IJ SOLN
INTRAMUSCULAR | Status: DC | PRN
  Administered 2013-01-06 (×3): 2 mg via INTRAVENOUS

## 2013-01-06 MED ORDER — MIDAZOLAM HCL 5 MG/ML IJ SOLN
INTRAMUSCULAR | Status: AC
  Filled 2013-01-06: qty 2

## 2013-01-06 MED ORDER — SODIUM CHLORIDE 0.9 % IV SOLN
INTRAVENOUS | Status: DC
  Administered 2013-01-06: 11:00:00 via INTRAVENOUS
  Administered 2013-01-06: 500 mL via INTRAVENOUS

## 2013-01-06 MED ORDER — FENTANYL CITRATE 0.05 MG/ML IJ SOLN
INTRAMUSCULAR | Status: DC | PRN
  Administered 2013-01-06: 25 ug via INTRAVENOUS

## 2013-01-06 MED ORDER — BUTAMBEN-TETRACAINE-BENZOCAINE 2-2-14 % EX AERO
INHALATION_SPRAY | CUTANEOUS | Status: DC | PRN
  Administered 2013-01-06: 2 via TOPICAL

## 2013-01-06 MED ORDER — DIPHENHYDRAMINE HCL 50 MG/ML IJ SOLN
INTRAMUSCULAR | Status: AC
  Filled 2013-01-06: qty 1

## 2013-01-06 MED ORDER — FENTANYL CITRATE 0.05 MG/ML IJ SOLN
INTRAMUSCULAR | Status: AC
  Filled 2013-01-06: qty 4

## 2013-01-06 NOTE — OR Nursing (Signed)
Patient hypotensive IVF have been increased.

## 2013-01-06 NOTE — Op Note (Signed)
[facility] [address] [place], [address]   ENDOSCOPY PROCEDURE REPORT  PATIENT: Linda Hughes, Linda Hughes  MR#: [id number] BIRTHDATE: Feb 24, 1946 , 67  yrs. old GENDER: Female ENDOSCOPIST:Chenita Ruda Madilyn Fireman, MD REFERRED BY: PROCEDURE DATE:  01/06/2013 PROCEDURE: ASA CLASS: INDICATIONS:   left upper quadrant abdominal pain MEDICATION:    fentanyl 25 mcg, Versed 2 mg TOPICAL ANESTHETIC:    Cetacaine spray  DESCRIPTION OF PROCEDURE:   esophagus: Normal  Stomach: Mild antral gastritis with no erosions or ulcers. Biopsies taken to rule out H. pylori  Duodenum: Normal     COMPLICATIONS: None  ENDOSCOPIC IMPRESSION:mild antral gastritis  RECOMMENDATIONS:await biopsy results    _______________________________ Rosalie DoctorDorena Cookey, MD 01/06/2013 10:07 AM

## 2013-01-06 NOTE — Op Note (Signed)
[facility] [address] [place], [address]   COLONOSCOPY PROCEDURE REPORT  PATIENT: Linda Hughes, Linda Hughes  MR#: [id number] BIRTHDATE: 1945/10/13 , 67  yrs. old GENDER: Female ENDOSCOPIST: Dorena Cookey, MD REFERRED BY: PROCEDURE DATE:  01/06/2013 PROCEDURE: ASA CLASS: INDICATIONS:  average risk colon cancer screening MEDICATIONS:    Versed 1 mg  DESCRIPTION OF PROCEDURE: the Pentax videocolonoscope was inserted into the rectum and advanced to the cecum, confirmed by transillumination a McBurney's point in visualization of the ileocecal valve and appendiceal orifice. The prep was good. The cecum appeared normal. The descending colon showed several scattered diverticuli. The transverse descending sigmoid and rectum appeared normal without further diverticuli or other abnormalities. There were some small internal hemorrhoids seen on withdrawal     COMPLICATIONS: None  ENDOSCOPIC IMPRESSION:right-sided diverticulosis otherwise normal study  RECOMMENDATIONS:repeat colonoscopy in 10 years for general screening purposes    _______________________________ eSigned:  Dorena Cookey, MD 01/06/2013 10:27 AM

## 2013-01-07 ENCOUNTER — Encounter (HOSPITAL_COMMUNITY): Payer: Self-pay | Admitting: Gastroenterology

## 2013-01-07 ENCOUNTER — Other Ambulatory Visit: Payer: Self-pay

## 2013-01-29 ENCOUNTER — Ambulatory Visit: Payer: No Typology Code available for payment source

## 2013-02-04 ENCOUNTER — Ambulatory Visit: Payer: No Typology Code available for payment source | Attending: Internal Medicine | Admitting: Pharmacist

## 2013-02-04 VITALS — BP 116/78 | HR 68 | Temp 98.2°F | Resp 17

## 2013-02-04 DIAGNOSIS — Z23 Encounter for immunization: Secondary | ICD-10-CM

## 2013-02-04 DIAGNOSIS — Z029 Encounter for administrative examinations, unspecified: Secondary | ICD-10-CM | POA: Insufficient documentation

## 2013-02-04 NOTE — Progress Notes (Signed)
Patient here for flu vaccine

## 2013-02-04 NOTE — Progress Notes (Signed)
   Subjective:    Patient ID: Linda Hughes, female    DOB: 10/03/1945, 67 y.o.   MRN: [id number]  HPI    Review of Systems     Objective:   Physical Exam        Assessment & Plan:   

## 2013-03-16 ENCOUNTER — Ambulatory Visit: Payer: No Typology Code available for payment source | Attending: Internal Medicine | Admitting: Internal Medicine

## 2013-03-16 ENCOUNTER — Encounter: Payer: Self-pay | Admitting: Internal Medicine

## 2013-03-16 VITALS — BP 130/79 | HR 62 | Temp 98.0°F | Resp 16 | Ht 60.0 in | Wt 120.0 lb

## 2013-03-16 DIAGNOSIS — E785 Hyperlipidemia, unspecified: Secondary | ICD-10-CM

## 2013-03-16 DIAGNOSIS — F411 Generalized anxiety disorder: Secondary | ICD-10-CM

## 2013-03-16 DIAGNOSIS — I10 Essential (primary) hypertension: Secondary | ICD-10-CM

## 2013-03-16 DIAGNOSIS — K219 Gastro-esophageal reflux disease without esophagitis: Secondary | ICD-10-CM

## 2013-03-16 DIAGNOSIS — R1013 Epigastric pain: Secondary | ICD-10-CM

## 2013-03-16 DIAGNOSIS — K3189 Other diseases of stomach and duodenum: Secondary | ICD-10-CM

## 2013-03-16 MED ORDER — CALCIUM CARBONATE-VITAMIN D 500-200 MG-UNIT PO TABS: 1.0000 | ORAL_TABLET | Freq: Every day | ORAL | Status: DC

## 2013-03-16 MED ORDER — METOPROLOL TARTRATE 50 MG PO TABS: 50.0000 mg | ORAL_TABLET | Freq: Two times a day (BID) | ORAL | Status: DC

## 2013-03-16 MED ORDER — OMEPRAZOLE 20 MG PO CPDR: 20.0000 mg | DELAYED_RELEASE_CAPSULE | Freq: Every day | ORAL | Status: DC

## 2013-03-16 MED ORDER — CITALOPRAM HYDROBROMIDE 40 MG PO TABS: 40.0000 mg | ORAL_TABLET | Freq: Every day | ORAL | Status: DC

## 2013-03-16 MED ORDER — PRAVASTATIN SODIUM 20 MG PO TABS: 20.0000 mg | ORAL_TABLET | Freq: Every day | ORAL | Status: DC

## 2013-03-16 MED ORDER — ADULT MULTIVITAMIN W/MINERALS CH: 1.0000 | ORAL_TABLET | Freq: Every day | ORAL | Status: AC

## 2013-03-16 NOTE — Patient Instructions (Signed)
Lipid Profile The lipid profile is a group of tests that are often ordered together to determine risk of coronary heart disease. The tests that make up a lipid profile are tests that have been shown to be good indicators of whether someone is likely to have a heart attack or stroke caused by blockage of blood vessels (hardening of the arteries).  The lipid profile includes total cholesterol, HDL-cholesterol (often called good cholesterol), LDL-cholesterol (often called bad cholesterol), and triglycerides. Sometimes the report will include additional calculated values such as HDL/Cholesterol ratio or a risk score based on lipid profile results, age, sex, and other risk factors.  Treatment is based on your overall risk of coronary heart disease. A target LDL is identified. If your LDL is above the target value, you will be treated. Your target LDL value is:   LDL less than 100 mg/dL (1.612.59 mmol/L) if you have heart disease or diabetes.  LDL less than 130 mg/dL (0.963.37 mmol/L) if you have 2 or more risk factors.  LDL less than 160 mg/dL (0.454.14 mmol/L) if you have 0 or 1 risk factor. Ranges for normal findings may vary among different laboratories and hospitals. You should always check with your doctor after having lab work or other tests done to discuss the meaning of your test results and whether your values are considered within normal limits. MEANING OF TEST  Your caregiver will go over the test results with you and discuss the importance and meaning of your results, as well as treatment options and the need for additional tests if necessary. OBTAINING THE TEST RESULTS It is your responsibility to obtain your test results. Ask the lab or department performing the test when and how you will get your results. Document Released: 03/23/2004 Document Revised: 05/13/2011 Document Reviewed: 01/30/2008 [facility]ExitCare Patient Information 2014 [place]. Cholesterol Cholesterol is a white, waxy, fat-like protein  needed by your body in small amounts. The liver makes all the cholesterol you need. It is carried from the liver by the blood through the blood vessels. Deposits (plaque) may build up on blood vessel walls. This makes the arteries narrower and stiffer. Plaque increases the risk for heart attack and stroke. You cannot feel your cholesterol level even if it is very high. The only way to know is by a blood test to check your lipid (fats) levels. Once you know your cholesterol levels, you should keep a record of the test results. Work with your caregiver to to keep your levels in the desired range. WHAT THE RESULTS MEAN:  Total cholesterol is a rough measure of all the cholesterol in your blood.  LDL is the so-called bad cholesterol. This is the type that deposits cholesterol in the walls of the arteries. You want this level to be low.  HDL is the good cholesterol because it cleans the arteries and carries the LDL away. You want this level to be high.  Triglycerides are fat that the body can either burn for energy or store. High levels are closely linked to heart disease. DESIRED LEVELS:  Total cholesterol below 200.  LDL below 100 for people at risk, below 70 for very high risk.  HDL above 50 is good, above 60 is best.  Triglycerides below 150. HOW TO LOWER YOUR CHOLESTEROL:  Diet.  Choose fish or white meat chicken and [place], roasted or baked. Limit fatty cuts of red meat, fried foods, and processed meats, such as sausage and lunch meat.  Eat lots of fresh fruits and vegetables.  Choose whole grains, beans, pasta, potatoes and cereals.  Use only small amounts of olive, corn or canola oils. Avoid butter, mayonnaise, shortening or palm kernel oils. Avoid foods with trans-fats.  Use skim/nonfat milk and low-fat/nonfat yogurt and cheeses. Avoid whole milk, cream, ice cream, egg yolks and cheeses. Healthy desserts include angel food cake, ginger snaps, animal crackers, hard candy, popsicles,  and low-fat/nonfat frozen yogurt. Avoid pastries, cakes, pies and cookies.  Exercise.  A regular program helps decrease LDL and raises HDL.  Helps with weight control.  Do things that increase your activity level like gardening, walking, or taking the stairs.  Medication.  May be prescribed by your caregiver to help lowering cholesterol and the risk for heart disease.  You may need medicine even if your levels are normal if you have several risk factors. HOME CARE INSTRUCTIONS   Follow your diet and exercise programs as suggested by your caregiver.  Take medications as directed.  Have blood work done when your caregiver feels it is necessary. MAKE SURE YOU:   Understand these instructions.  Will watch your condition.  Will get help right away if you are not doing well or get worse. Document Released: 11/13/2000 Document Revised: 05/13/2011 Document Reviewed: 12/02/2012 [facility] Patient Information 2014 [place], [place].

## 2013-03-16 NOTE — Progress Notes (Signed)
Pt is here today following up on her HTN and GERD. Pt states that she is fasting for her labs today.

## 2013-03-16 NOTE — Progress Notes (Signed)
Patient ID: Linda Hughes, female   DOB: 10-Jul-1945, 68 y.o.   MRN: [id number] Patient Demographics  Linda Hughes, is a 68 y.o. female  WUJ:[id number]  [id number]  DOB - 07-11-1945  Chief Complaint  Patient presents with  . Follow-up        Subjective:   Linda Hughes is a 68 y.o. female here today for a follow up visit. Patient is known to have hypertension, dyslipidemia and depression who presented to clinic for followup. Patient reports having persistent dyspepsia, reports having acid reflux controlled with Nexium. She has not had mammogram in a long time. She has no new complaint no concern. Needs a refill of her medications. No blood in stool, no nausea or vomiting. Blood pressure is controlled. Patient has No headache, No chest pain, No abdominal pain, No new weakness tingling or numbness, No Cough - SOB.  ALLERGIES: No Known Allergies  PAST MEDICAL HISTORY: Past Medical History  Diagnosis Date  . Hypertension   . High blood cholesterol   . GERD (gastroesophageal reflux disease)     MEDICATIONS AT HOME: Prior to Admission medications   Medication Sig Start Date End Date Taking? Authorizing Provider  calcium-vitamin D (OSCAL WITH D) 500-200 MG-UNIT per tablet Take 1 tablet by mouth daily with breakfast. 03/16/13  Yes Jeanann Lewandowsky, MD  citalopram (CELEXA) 40 MG tablet Take 1 tablet (40 mg total) by mouth daily. 03/16/13  Yes Jeanann Lewandowsky, MD  fish oil-omega-3 fatty acids 1000 MG capsule Take 1 g by mouth daily.   Yes Historical Provider, MD  metoprolol (LOPRESSOR) 50 MG tablet Take 1 tablet (50 mg total) by mouth 2 (two) times daily. 03/16/13  Yes Jeanann Lewandowsky, MD  Multiple Vitamin (MULTIVITAMIN WITH MINERALS) TABS tablet Take 1 tablet by mouth daily. 03/16/13  Yes Jeanann Lewandowsky, MD  omeprazole (PRILOSEC) 20 MG capsule Take 1 capsule (20 mg total) by mouth daily. 03/16/13  Yes Jeanann Lewandowsky, MD  esomeprazole (NEXIUM) 20 MG capsule Take 1 capsule (20 mg total)  by mouth daily before breakfast. 12/14/12   Alison Murray, MD  pravastatin (PRAVACHOL) 20 MG tablet Take 1 tablet (20 mg total) by mouth daily. 03/16/13   Jeanann Lewandowsky, MD     Objective:   Filed Vitals:   03/16/13 0910  BP: 130/79  Pulse: 62  Temp: 98 F (36.7 C)  TempSrc: Oral  Resp: 16  Height: 5' (1.524 m)  Weight: 120 lb (54.432 kg)  SpO2: 96%    Exam General appearance : Awake, alert, not in any distress. Speech Clear. Not toxic looking HEENT: Atraumatic and Normocephalic, pupils equally reactive to light and accomodation Neck: supple, no JVD. No cervical lymphadenopathy.  Chest:Good air entry bilaterally, no added sounds  CVS: S1 S2 regular, no murmurs.  Abdomen: Bowel sounds present, Non tender and not distended with no gaurding, rigidity or rebound. Extremities: B/L Lower Ext shows no edema, both legs are warm to touch Neurology: Awake alert, and oriented X 3, CN II-XII intact, Non focal Skin:No Rash Wounds:N/A   Data Review   CBC No results found for this basename: WBC, HGB, HCT, PLT, MCV, MCH, MCHC, RDW, NEUTRABS, LYMPHSABS, MONOABS, EOSABS, BASOSABS, BANDABS, BANDSABD,  in the last 168 hours  Chemistries   No results found for this basename: NA, K, CL, CO2, GLUCOSE, BUN, CREATININE, GFRCGP, CALCIUM, MG, AST, ALT, ALKPHOS, BILITOT,  in the last 168 hours ------------------------------------------------------------------------------------------------------------------ No results found for this basename: HGBA1C,  in the last 72 hours ------------------------------------------------------------------------------------------------------------------ No results found  for this basename: CHOL, HDL, LDLCALC, TRIG, CHOLHDL, LDLDIRECT,  in the last 72 hours ------------------------------------------------------------------------------------------------------------------ No results found for this basename: TSH, T4TOTAL, FREET3, T3FREE, THYROIDAB,  in the last 72  hours ------------------------------------------------------------------------------------------------------------------ No results found for this basename: VITAMINB12, FOLATE, FERRITIN, TIBC, IRON, RETICCTPCT,  in the last 72 hours  Coagulation profile  No results found for this basename: INR, PROTIME,  in the last 168 hours    Assessment & Plan   1. Dyspepsia Continue PPI  2. Unspecified essential hypertension  - metoprolol (LOPRESSOR) 50 MG tablet; Take 1 tablet (50 mg total) by mouth 2 (two) times daily.  Dispense: 180 tablet; Refill: 3  3. GERD (gastroesophageal reflux disease)  - omeprazole (PRILOSEC) 20 MG capsule; Take 1 capsule (20 mg total) by mouth daily.  Dispense: 90 capsule; Refill: 3  4. Dyslipidemia - pravastatin (PRAVACHOL) 20 MG tablet; Take 1 tablet (20 mg total) by mouth daily.  Dispense: 90 tablet; Refill: 3 - calcium-vitamin D (OSCAL WITH D) 500-200 MG-UNIT per tablet; Take 1 tablet by mouth daily with breakfast.  Dispense: 90 tablet; Refill: 3 - Multiple Vitamin (MULTIVITAMIN WITH MINERALS) TABS tablet; Take 1 tablet by mouth daily.  Dispense: 90 tablet; Refill: 3  5. Generalized anxiety disorder  - citalopram (CELEXA) 40 MG tablet; Take 1 tablet (40 mg total) by mouth daily.  Dispense: 90 tablet; Refill: 3  - MM Digital Screening; Future   Follow up in 3 months or when necessary   The patient was given clear instructions to go to ER or return to medical center if symptoms don't improve, worsen or new problems develop. The patient verbalized understanding. The patient was told to call to get lab results if they haven't heard anything in the next week.    Jeanann LewandowskyJEGEDE, Maurie Olesen, MD, MHA, FACP, FAAP [facility] [place], [place] [phone]   03/16/2013, 9:59 AM

## 2013-07-20 ENCOUNTER — Ambulatory Visit: Payer: No Typology Code available for payment source | Attending: Internal Medicine | Admitting: Internal Medicine

## 2013-07-20 ENCOUNTER — Encounter: Payer: Self-pay | Admitting: Internal Medicine

## 2013-07-20 VITALS — BP 128/79 | HR 57 | Temp 98.3°F | Resp 16 | Ht 60.0 in | Wt 119.0 lb

## 2013-07-20 DIAGNOSIS — K219 Gastro-esophageal reflux disease without esophagitis: Secondary | ICD-10-CM | POA: Insufficient documentation

## 2013-07-20 DIAGNOSIS — I10 Essential (primary) hypertension: Secondary | ICD-10-CM | POA: Insufficient documentation

## 2013-07-20 DIAGNOSIS — F411 Generalized anxiety disorder: Secondary | ICD-10-CM | POA: Insufficient documentation

## 2013-07-20 DIAGNOSIS — E785 Hyperlipidemia, unspecified: Secondary | ICD-10-CM | POA: Insufficient documentation

## 2013-07-20 DIAGNOSIS — Z1231 Encounter for screening mammogram for malignant neoplasm of breast: Secondary | ICD-10-CM | POA: Insufficient documentation

## 2013-07-20 MED ORDER — CITALOPRAM HYDROBROMIDE 40 MG PO TABS: 40.0000 mg | ORAL_TABLET | Freq: Every day | ORAL | Status: DC

## 2013-07-20 MED ORDER — METOPROLOL TARTRATE 50 MG PO TABS: 50.0000 mg | ORAL_TABLET | Freq: Two times a day (BID) | ORAL | Status: DC

## 2013-07-20 MED ORDER — PRAVASTATIN SODIUM 20 MG PO TABS: 20.0000 mg | ORAL_TABLET | Freq: Every day | ORAL | Status: DC

## 2013-07-20 NOTE — Progress Notes (Signed)
Pt is here following up on her HTN and high cholesterol. Pt has no C.C.

## 2013-07-20 NOTE — Patient Instructions (Signed)
DASH Diet  The DASH diet stands for "Dietary Approaches to Stop Hypertension." It is a healthy eating plan that has been shown to reduce high blood pressure (hypertension) in as little as 14 days, while also possibly providing other significant health benefits. These other health benefits include reducing the risk of breast cancer after menopause and reducing the risk of type 2 diabetes, heart disease, colon cancer, and stroke. Health benefits also include weight loss and slowing kidney failure in patients with chronic kidney disease.   DIET GUIDELINES  · Limit salt (sodium). Your diet should contain less than 1500 mg of sodium daily.  · Limit refined or processed carbohydrates. Your diet should include mostly whole grains. Desserts and added sugars should be used sparingly.  · Include small amounts of heart-healthy fats. These types of fats include nuts, oils, and tub margarine. Limit saturated and trans fats. These fats have been shown to be harmful in the body.  CHOOSING FOODS   The following food groups are based on a 2000 calorie diet. See your Registered Dietitian for individual calorie needs.  Grains and Grain Products (6 to 8 servings daily)  · Eat More Often: Whole-wheat bread, brown rice, whole-grain or wheat pasta, quinoa, popcorn without added fat or salt (air popped).  · Eat Less Often: White bread, white pasta, white rice, cornbread.  Vegetables (4 to 5 servings daily)  · Eat More Often: Fresh, frozen, and canned vegetables. Vegetables may be raw, steamed, roasted, or grilled with a minimal amount of fat.  · Eat Less Often/Avoid: Creamed or fried vegetables. Vegetables in a cheese sauce.  Fruit (4 to 5 servings daily)  · Eat More Often: All fresh, canned (in natural juice), or frozen fruits. Dried fruits without added sugar. One hundred percent fruit juice (½ cup [237 mL] daily).  · Eat Less Often: Dried fruits with added sugar. Canned fruit in light or heavy syrup.  Lean Meats, Fish, and Poultry (2  servings or less daily. One serving is 3 to 4 oz [85-114 g]).  · Eat More Often: Ninety percent or leaner ground beef, tenderloin, sirloin. Round cuts of beef, chicken breast, turkey breast. All fish. Grill, bake, or broil your meat. Nothing should be fried.  · Eat Less Often/Avoid: Fatty cuts of meat, turkey, or chicken leg, thigh, or wing. Fried cuts of meat or fish.  Dairy (2 to 3 servings)  · Eat More Often: Low-fat or fat-free milk, low-fat plain or light yogurt, reduced-fat or part-skim cheese.  · Eat Less Often/Avoid: Milk (whole, 2%). Whole milk yogurt. Full-fat cheeses.  Nuts, Seeds, and Legumes (4 to 5 servings per week)  · Eat More Often: All without added salt.  · Eat Less Often/Avoid: Salted nuts and seeds, canned beans with added salt.  Fats and Sweets (limited)  · Eat More Often: Vegetable oils, tub margarines without trans fats, sugar-free gelatin. Mayonnaise and salad dressings.  · Eat Less Often/Avoid: Coconut oils, palm oils, butter, stick margarine, cream, half and half, cookies, candy, pie.  FOR MORE INFORMATION  The Dash Diet Eating Plan: www.dashdiet.org  Document Released: 02/07/2011 Document Revised: 05/13/2011 Document Reviewed: 02/07/2011  ExitCare® Patient Information ©2014 ExitCare, LLC.

## 2013-07-20 NOTE — Progress Notes (Signed)
Patient ID: Linda Hughes, female   DOB: [birth year], 68 y.o.   MRN: [id number]   Linda Hughes, is a 68 y.o. female  [id number]  [id number]  DOB - [birth year]  Chief Complaint  Patient presents with  . Follow-up        Subjective:   Linda Hughes is a 68 y.o. very pleasant female here today for a follow up visit. Patient is known to have hypertension, dyslipidemia and depression. She needs medication refill. She was also due for this years mammogram. She is requesting for Pap smear even though I had counseled her that Pap smear is not recommended after age 69 but she says she has not had one done for long time and she would like to have the last one for her peace of mind. She has no complaint today otherwise. Patient has No headache, No chest pain, No abdominal pain - No Nausea, No new weakness tingling or numbness, No Cough - SOB.  Problem  Visit for Screening Mammogram    ALLERGIES: No Known Allergies  PAST MEDICAL HISTORY: Past Medical History  Diagnosis Date  . Hypertension   . High blood cholesterol   . GERD (gastroesophageal reflux disease)     MEDICATIONS AT HOME: Prior to Admission medications   Medication Sig Start Date End Date Taking? Authorizing Provider  calcium-vitamin D (OSCAL WITH D) 500-200 MG-UNIT per tablet Take 1 tablet by mouth daily with breakfast. 03/16/13  Yes Jeanann Lewandowskylugbemiga Darek Eifler, MD  citalopram (CELEXA) 40 MG tablet Take 1 tablet (40 mg total) by mouth daily. 07/20/13  Yes Jeanann Lewandowskylugbemiga Jadan Hinojos, MD  fish oil-omega-3 fatty acids 1000 MG capsule Take 1 g by mouth daily.   Yes Historical Provider, MD  metoprolol (LOPRESSOR) 50 MG tablet Take 1 tablet (50 mg total) by mouth 2 (two) times daily. 07/20/13  Yes Jeanann Lewandowskylugbemiga Laban Orourke, MD  Multiple Vitamin (MULTIVITAMIN WITH MINERALS) TABS tablet Take 1 tablet by mouth daily. 03/16/13  Yes Jeanann Lewandowskylugbemiga Fines Kimberlin, MD  pravastatin (PRAVACHOL) 20 MG tablet Take 1 tablet (20 mg total) by mouth daily. 07/20/13  Yes Jeanann Lewandowskylugbemiga Aksel Bencomo, MD       Objective:   Filed Vitals:   07/20/13 0907  BP: 128/79  Pulse: 57  Temp: 98.3 F (36.8 C)  TempSrc: Oral  Resp: 16  Height: 5' (1.524 m)  Weight: 119 lb (53.978 kg)  SpO2: 100%    Exam General appearance : Awake, alert, not in any distress. Speech Clear. Not toxic looking HEENT: Atraumatic and Normocephalic, pupils equally reactive to light and accomodation Neck: supple, no JVD. No cervical lymphadenopathy.  Chest:Good air entry bilaterally, no added sounds  CVS: S1 S2 regular, no murmurs.  Abdomen: Bowel sounds present, Non tender and not distended with no gaurding, rigidity or rebound. Extremities: B/L Lower Ext shows no edema, both legs are warm to touch Neurology: Awake alert, and oriented X 3, CN II-XII intact, Non focal Skin:No Rash Wounds:N/A  Data Review No results found for this basename: HGBA1C     Assessment & Plan   1. Dyslipidemia Refill - pravastatin (PRAVACHOL) 20 MG tablet; Take 1 tablet (20 mg total) by mouth daily.  Dispense: 90 tablet; Refill: 3  2. Visit for screening mammogram  - MM DIGITAL SCREENING BILATERAL; Future  3. Unspecified essential hypertension Refill - metoprolol (LOPRESSOR) 50 MG tablet; Take 1 tablet (50 mg total) by mouth 2 (two) times daily.  Dispense: 180 tablet; Refill: 3  4. Generalized anxiety disorder Refill - citalopram (CELEXA) 40 MG tablet; Take 1  tablet (40 mg total) by mouth daily.  Dispense: 90 tablet; Refill: 3  Patient was counseled extensively about nutrition and exercise  Return in about 3 months (around 10/20/2013), or if symptoms worsen or fail to improve, for Pap Smear With Holland CommonsValerie Keck.  The patient was given clear instructions to go to ER or return to medical center if symptoms don't improve, worsen or new problems develop. The patient verbalized understanding. The patient was told to call to get lab results if they haven't heard anything in the next week.   This note has been created with Biomedical engineerDragon  speech recognition software and smart phrase technology. Any transcriptional errors are unintentional.    Jeanann Lewandowskylugbemiga Bronwyn Belasco, MD, MHA, FACP, [facility] [place], [place] [phone]   07/20/2013, 10:01 AM

## 2013-07-22 ENCOUNTER — Ambulatory Visit (HOSPITAL_COMMUNITY)
Admission: RE | Admit: 2013-07-22 | Discharge: 2013-07-22 | Disposition: A | Discharge status: Home / Self Care | Payer: No Typology Code available for payment source | Source: Ambulatory Visit | Attending: Internal Medicine | Admitting: Internal Medicine

## 2013-07-22 DIAGNOSIS — Z1231 Encounter for screening mammogram for malignant neoplasm of breast: Secondary | ICD-10-CM | POA: Insufficient documentation

## 2013-07-27 ENCOUNTER — Telehealth: Payer: Self-pay | Admitting: *Deleted

## 2013-07-27 NOTE — Telephone Encounter (Signed)
Wrong number and the other phone will not allow me to leave a message.

## 2013-07-27 NOTE — Telephone Encounter (Signed)
Message copied by Raynelle Chary on Tue Jul 27, 2013  9:36 AM ------      Message from: Quentin Angst      Created: Fri Jul 23, 2013  3:47 PM       Please inform patient that her mammogram showed no evidence of malignancy ------

## 2013-08-04 ENCOUNTER — Ambulatory Visit: Payer: No Typology Code available for payment source | Attending: Internal Medicine | Admitting: Internal Medicine

## 2013-08-04 ENCOUNTER — Encounter: Payer: Self-pay | Admitting: Internal Medicine

## 2013-08-04 VITALS — BP 128/73 | HR 56 | Temp 97.9°F | Resp 16 | Ht 60.0 in | Wt 121.6 lb

## 2013-08-04 DIAGNOSIS — Z01419 Encounter for gynecological examination (general) (routine) without abnormal findings: Secondary | ICD-10-CM | POA: Insufficient documentation

## 2013-08-04 DIAGNOSIS — Z0000 Encounter for general adult medical examination without abnormal findings: Secondary | ICD-10-CM

## 2013-08-04 NOTE — Patient Instructions (Signed)

## 2013-08-04 NOTE — Progress Notes (Signed)
Patient presents for pap smear and breast exam only.

## 2013-08-04 NOTE — Progress Notes (Signed)
Patient ID: Linda Hughes, female   DOB: 07/27/1945, 68 y.o.   MRN: [id number]  CC: pap smear  HPI: Patient is a 68 year old female that presents to clinic today for a pap smear and breast exam.  Patient reports that her last pap smear was 3 years ago and last mammography was 2 weeks ago. She reports that they both were normal.  No c/o today. Patient states that Dr. Hyman Hughes told her she did not need a pap due to her age but insisted that she get a second opinion.    No Known Allergies Past Medical History  Diagnosis Date  . Hypertension   . High blood cholesterol   . GERD (gastroesophageal reflux disease)    Current Outpatient Prescriptions on File Prior to Visit  Medication Sig Dispense Refill  . calcium-vitamin D (OSCAL WITH D) 500-200 MG-UNIT per tablet Take 1 tablet by mouth daily with breakfast.  90 tablet  3  . citalopram (CELEXA) 40 MG tablet Take 1 tablet (40 mg total) by mouth daily.  90 tablet  3  . fish oil-omega-3 fatty acids 1000 MG capsule Take 1 g by mouth daily.      . metoprolol (LOPRESSOR) 50 MG tablet Take 1 tablet (50 mg total) by mouth 2 (two) times daily.  180 tablet  3  . Multiple Vitamin (MULTIVITAMIN WITH MINERALS) TABS tablet Take 1 tablet by mouth daily.  90 tablet  3  . pravastatin (PRAVACHOL) 20 MG tablet Take 1 tablet (20 mg total) by mouth daily.  90 tablet  3  . [DISCONTINUED] metoprolol succinate (TOPROL-XL) 50 MG 24 hr tablet Take 1 tablet (50 mg total) by mouth daily. Take with or immediately following a meal.  30 tablet  3  . [DISCONTINUED] propranolol (INDERAL) 40 MG tablet Take 40 mg by mouth 3 (three) times daily.       No current facility-administered medications on file prior to visit.   Family History  Problem Relation Age of Onset  . Cancer Sister    History   Social History  . Marital Status: Married    Spouse Name: N/A    Number of Children: N/A  . Years of Education: N/A   Occupational History  . Not on file.   Social History Main  Topics  . Smoking status: Never Smoker   . Smokeless tobacco: Not on file  . Alcohol Use: No  . Drug Use: No  . Sexual Activity: Not on file   Other Topics Concern  . Not on file   Social History Narrative  . No narrative on file   Review of Systems  All other systems reviewed and are negative.     Objective:   Filed Vitals:   08/04/13 0913  BP: 128/73  Pulse: 56  Temp: 97.9 F (36.6 C)  Resp: 16   Physical Exam  Vitals reviewed. Cardiovascular: Normal rate, regular rhythm and normal heart sounds.   Pulmonary/Chest: Effort normal and breath sounds normal. Right breast exhibits no mass, no nipple discharge, no skin change and no tenderness. Left breast exhibits no mass, no nipple discharge, no skin change and no tenderness.  Abdominal: Soft. She exhibits no distension. There is no tenderness.  Genitourinary: There is no rash or lesion on the right labia. There is no rash or lesion on the left labia. Cervix exhibits no motion tenderness. Right adnexum displays mass. Right adnexum displays no tenderness. Left adnexum displays no mass and no tenderness.  Skin: Skin is  warm and dry.  Psychiatric: She has a normal mood and affect. Thought content normal.     Lab Results  Component Value Date   WBC 5.4 09/28/2012   HGB 13.7 09/28/2012   HCT 41.9 09/28/2012   MCV 92.9 09/28/2012   PLT 218 09/28/2012   Lab Results  Component Value Date   CREATININE 0.90 12/14/2012   BUN 14 12/14/2012   NA 141 12/14/2012   K 4.2 12/14/2012   CL 101 12/14/2012   CO2 33* 12/14/2012    No results found for this basename: HGBA1C   Lipid Panel     Component Value Date/Time   CHOL 184 12/14/2012 0938   TRIG 101 12/14/2012 0938   HDL 67 12/14/2012 0938   CHOLHDL 2.7 12/14/2012 0938   VLDL 20 12/14/2012 0938   LDLCALC 97 12/14/2012 0938       Assessment and plan:   Itsel was seen today for gynecologic exam.  Diagnoses and associated orders for this visit:  Preventative health  care Explained to patient that it is not recommended to do pap smears after the age of 69.  I looked over patients last two pap smears and they were normal. Patient denies any vaginal bleeding. Bimanual only today.    Keep f/u appointment with Linda Hughes.     Linda FinlandValerie A Keck, NP-C [facility] [phone] 08/04/2013, 9:38 AM

## 2013-10-21 ENCOUNTER — Inpatient Hospital Stay (HOSPITAL_COMMUNITY)
Admission: RE | Admit: 2013-10-21 | Discharge: 2013-10-21 | Disposition: A | Discharge status: Home / Self Care | Payer: No Typology Code available for payment source | Source: Ambulatory Visit

## 2013-10-21 ENCOUNTER — Ambulatory Visit: Payer: No Typology Code available for payment source | Attending: Internal Medicine | Admitting: Internal Medicine

## 2013-10-21 ENCOUNTER — Encounter: Payer: Self-pay | Admitting: Internal Medicine

## 2013-10-21 VITALS — BP 164/91 | HR 60 | Temp 98.5°F | Resp 16 | Ht 60.0 in | Wt 119.6 lb

## 2013-10-21 DIAGNOSIS — F411 Generalized anxiety disorder: Secondary | ICD-10-CM | POA: Insufficient documentation

## 2013-10-21 DIAGNOSIS — E785 Hyperlipidemia, unspecified: Secondary | ICD-10-CM | POA: Insufficient documentation

## 2013-10-21 DIAGNOSIS — I10 Essential (primary) hypertension: Secondary | ICD-10-CM | POA: Insufficient documentation

## 2013-10-21 DIAGNOSIS — R072 Precordial pain: Secondary | ICD-10-CM

## 2013-10-21 MED ORDER — PRAVASTATIN SODIUM 20 MG PO TABS: 20.0000 mg | ORAL_TABLET | Freq: Every day | ORAL | Status: DC

## 2013-10-21 MED ORDER — CALCIUM CARBONATE-VITAMIN D 500-200 MG-UNIT PO TABS: 1.0000 | ORAL_TABLET | Freq: Every day | ORAL | Status: DC

## 2013-10-21 MED ORDER — CITALOPRAM HYDROBROMIDE 40 MG PO TABS: 40.0000 mg | ORAL_TABLET | Freq: Every day | ORAL | Status: DC

## 2013-10-21 MED ORDER — METOPROLOL TARTRATE 50 MG PO TABS: 50.0000 mg | ORAL_TABLET | Freq: Two times a day (BID) | ORAL | Status: DC

## 2013-10-21 NOTE — Progress Notes (Signed)
Patient ID: Linda Hughes, female   DOB: 20-Jan-1946, 68 y.o.   MRN: [id number]   Linda Hughes, is a 68 y.o. female  [id number]  NFA:[id number]  DOB - 20-Jan-1946  Chief Complaint  Patient presents with  . Follow-up  . Hypertension  . Chest Pain        Subjective:   Linda Hughes is a 68 y.o. female here today for a follow up visit. Patient is known to have hypertension, dyslipidemia, anxiety and depression, here today with major complaint of chest pain has been ongoing for about 5 years. Patient claims her pain is not related to meal, no known aggravating factors nor relieving factors, no periodicity. Pain does not wake her up at night, mostly located to the left side of chest radiating to left arm and shoulder. She does not experience diaphoresis. Pain intensity to be up to 8/10 and is intermittent. She has been investigated in the past with no significant findings especially relating to cardiac origin of pain, she had EGD in November 2014 which showed mild gastritis, her last colonoscopy at the same time showed mild right-sided diverticulosis otherwise normal. She has not had stress test ever. Her last lipid profile was normal. Her blood pressure has been relatively controlled but patient claimed that she has been under intensive stress lately that has been driving the blood pressure. She gave me a log of her ambulatory blood pressure, has a highest systolic of 138 and diastolic of 89 mmHg at home. Her medications are listed below, patient reports no side effects from claims to be compliant. She would like to wait and control her stress before changing blood pressure medicine. Patient does not smoke cigarette, she does not drink alcohol. Her mammogram was normal. Patient has No headache, No chest pain, No abdominal pain - No Nausea, No new weakness tingling or numbness, No Cough - SOB.  Problem  Precordial Pain    ALLERGIES: No Known Allergies  PAST MEDICAL HISTORY: Past Medical History    Diagnosis Date  . Hypertension   . High blood cholesterol   . GERD (gastroesophageal reflux disease)     MEDICATIONS AT HOME: Prior to Admission medications   Medication Sig Start Date End Date Taking? Authorizing Provider  calcium-vitamin D (OSCAL WITH D) 500-200 MG-UNIT per tablet Take 1 tablet by mouth daily with breakfast. 10/21/13  Yes Jeanann Lewandowskylugbemiga Retta Pitcher, MD  citalopram (CELEXA) 40 MG tablet Take 1 tablet (40 mg total) by mouth daily. 10/21/13  Yes Jeanann Lewandowskylugbemiga Karina Nofsinger, MD  fish oil-omega-3 fatty acids 1000 MG capsule Take 1 g by mouth daily.   Yes Historical Provider, MD  metoprolol (LOPRESSOR) 50 MG tablet Take 1 tablet (50 mg total) by mouth 2 (two) times daily. 10/21/13  Yes Jeanann Lewandowskylugbemiga Hugh Garrow, MD  Multiple Vitamin (MULTIVITAMIN WITH MINERALS) TABS tablet Take 1 tablet by mouth daily. 03/16/13  Yes Jeanann Lewandowskylugbemiga Johanny Segers, MD  pravastatin (PRAVACHOL) 20 MG tablet Take 1 tablet (20 mg total) by mouth daily. 10/21/13  Yes Jeanann Lewandowskylugbemiga Arturo Sofranko, MD     Objective:   Filed Vitals:   10/21/13 0929  BP: 164/91  Pulse: 60  Temp: 98.5 F (36.9 C)  TempSrc: Oral  Resp: 16  Height: 5' (1.524 m)  Weight: 54.25 kg (119 lb 9.6 oz)  SpO2: 99%    Exam General appearance : Awake, alert, not in any distress. Speech Clear. Not toxic looking HEENT: Atraumatic and Normocephalic, pupils equally reactive to light and accomodation Neck: supple, no JVD. No cervical lymphadenopathy.  Chest:Good air  entry bilaterally, no added sounds  CVS: S1 S2 regular, no murmurs.  Abdomen: Bowel sounds present, Non tender and not distended with no gaurding, rigidity or rebound. Extremities: B/L Lower Ext shows no edema, both legs are warm to touch Neurology: Awake alert, and oriented X 3, CN II-XII intact, Non focal Skin:No Rash Wounds:N/A  Data Review No results found for this basename: HGBA1C   EKG done today showed normal sinus rhythm  Assessment & Plan   1. Dyslipidemia  - calcium-vitamin D (OSCAL WITH D)  500-200 MG-UNIT per tablet; Take 1 tablet by mouth daily with breakfast.  Dispense: 90 tablet; Refill: 3 - pravastatin (PRAVACHOL) 20 MG tablet; Take 1 tablet (20 mg total) by mouth daily.  Dispense: 90 tablet; Refill: 3  2. Generalized anxiety disorder  - citalopram (CELEXA) 40 MG tablet; Take 1 tablet (40 mg total) by mouth daily.  Dispense: 90 tablet; Refill: 3  3. Unspecified essential hypertension  - metoprolol (LOPRESSOR) 50 MG tablet; Take 1 tablet (50 mg total) by mouth 2 (two) times daily.  Dispense: 180 tablet; Refill: 3  4. Precordial pain: Most likely noncardiac but because of patient's age, hypertension and her personal concern, we'll refer to cardiologist for possible stress test  - Ambulatory referral to Cardiology  Patient was counseled extensively about nutrition and exercise  Return in about 4 weeks (around 11/18/2013), or if symptoms worsen or fail to improve, for BP Check, Nurse Visit, Follow up HTN.  The patient was given clear instructions to go to ER or return to medical center if symptoms don't improve, worsen or new problems develop. The patient verbalized understanding. The patient was told to call to get lab results if they haven't heard anything in the next week.   This note has been created with Education officer, environmental. Any transcriptional errors are unintentional.    Jeanann Lewandowsky, MD, MHA, FACP, FAAP [facility] [place], [place] [phone]   10/21/2013, 10:26 AM

## 2013-10-21 NOTE — Progress Notes (Signed)
Pt here to f/u HTN, Cholesterol with medication management Pt is taking meds daily BP elevated 164/91/ 60- states she has been under stress lately C/o left chest wall pain radiating L arm/under breast intermit x 2 mnths Last EKG negative

## 2013-10-27 ENCOUNTER — Ambulatory Visit: Payer: No Typology Code available for payment source | Attending: Cardiology | Admitting: Cardiology

## 2013-10-27 ENCOUNTER — Encounter: Payer: Self-pay | Admitting: Cardiology

## 2013-10-27 VITALS — BP 161/87 | HR 58 | Temp 98.7°F | Resp 16 | Ht 60.0 in | Wt 120.0 lb

## 2013-10-27 DIAGNOSIS — R072 Precordial pain: Secondary | ICD-10-CM | POA: Insufficient documentation

## 2013-10-27 DIAGNOSIS — K219 Gastro-esophageal reflux disease without esophagitis: Secondary | ICD-10-CM | POA: Insufficient documentation

## 2013-10-27 DIAGNOSIS — E7800 Pure hypercholesterolemia, unspecified: Secondary | ICD-10-CM | POA: Insufficient documentation

## 2013-10-27 DIAGNOSIS — N951 Menopausal and female climacteric states: Secondary | ICD-10-CM | POA: Insufficient documentation

## 2013-10-27 DIAGNOSIS — I10 Essential (primary) hypertension: Secondary | ICD-10-CM | POA: Insufficient documentation

## 2013-10-27 MED ORDER — AMLODIPINE BESYLATE 2.5 MG PO TABS: 2.5000 mg | ORAL_TABLET | Freq: Every day | ORAL | Status: DC

## 2013-10-27 MED ORDER — METOPROLOL TARTRATE 50 MG PO TABS: 50.0000 mg | ORAL_TABLET | Freq: Two times a day (BID) | ORAL | Status: DC

## 2013-10-27 NOTE — Assessment & Plan Note (Signed)
This is noncardiac. I reassured patient. I think much of it is related to stress and tension. We've also addressed these issues today as well. I've encouraged her to stay active. I reviewed her cardiac risk factors with her today and made it clear that she is needing better control of her blood pressure. We have added amlodipine 2.5 mg a day. She will continue metoprolol. No cardiac followup necessary. Lipids are at goal.

## 2013-10-27 NOTE — Progress Notes (Signed)
HPI Linda Hughes comes in today per Dr.Jegede for the evaluation and management of chest pain.  The chest discomfort is sharp and localized just above the left breast. It sometimes will radiate into the left axilla. It only lasts a few seconds. Time she gets a little tingling in her left arm. There no other associated symptoms including nausea, vomiting, diaphoresis or shortness of breath. It is not exertion related.  She states that she is under a lot of stress trying to get her citizenship as well as financial concerns.  She has a history of gastroesophageal reflux with mild gastritis on recent endoscopy. EKG is remarkable for sinus bradycardia otherwise normal. She is on metoprolol.  Cardiac risk factors include age, hyperlipidemia with numbers at goal last October on pravastatin, and hypertension. Her last 2 visits have shown suboptimal control particularly of her systolic pressure.  Past Medical History  Diagnosis Date  . Hypertension   . High blood cholesterol   . GERD (gastroesophageal reflux disease)     Current Outpatient Prescriptions  Medication Sig Dispense Refill  . calcium-vitamin D (OSCAL WITH D) 500-200 MG-UNIT per tablet Take 1 tablet by mouth daily with breakfast.  90 tablet  3  . citalopram (CELEXA) 40 MG tablet Take 1 tablet (40 mg total) by mouth daily.  90 tablet  3  . fish oil-omega-3 fatty acids 1000 MG capsule Take 1 g by mouth daily.      . Multiple Vitamin (MULTIVITAMIN WITH MINERALS) TABS tablet Take 1 tablet by mouth daily.  90 tablet  3  . pravastatin (PRAVACHOL) 20 MG tablet Take 1 tablet (20 mg total) by mouth daily.  90 tablet  3  . amLODipine (NORVASC) 2.5 MG tablet Take 1 tablet (2.5 mg total) by mouth daily.  90 tablet  3  . [DISCONTINUED] metoprolol succinate (TOPROL-XL) 50 MG 24 hr tablet Take 1 tablet (50 mg total) by mouth daily. Take with or immediately following a meal.  30 tablet  3  . [DISCONTINUED] propranolol (INDERAL) 40 MG tablet Take 40 mg by  mouth 3 (three) times daily.       No current facility-administered medications for this visit.    No Known Allergies  Family History  Problem Relation Age of Onset  . Cancer Sister     History   Social History  . Marital Status: Married    Spouse Name: N/A    Number of Children: N/A  . Years of Education: N/A   Occupational History  . Not on file.   Social History Main Topics  . Smoking status: Never Smoker   . Smokeless tobacco: Not on file  . Alcohol Use: No  . Drug Use: No  . Sexual Activity: Not on file   Other Topics Concern  . Not on file   Social History Narrative  . No narrative on file    ROS ALL NEGATIVE EXCEPT THOSE NOTED IN HPI  PE  General Appearance: well developed, well nourished in no acute distress HEENT: symmetrical face, PERRLA, good dentition  Neck: no JVD, thyromegaly, or adenopathy, trachea midline Chest: symmetric without deformity Cardiac: PMI non-displaced, RRR, normal S1, S2, no gallop or murmur Lung: clear to ausculation and percussion Vascular: all pulses full without bruits  Abdominal: nondistended, nontender, good bowel sounds, no HSM, no bruits Extremities: no cyanosis, clubbing or edema, no sign of DVT, no varicosities  Skin: normal color, no rashes Neuro: alert and oriented x 3, non-focal Pysch: normal affect  EKG Not repeated  BMET    Component Value Date/Time   NA 141 12/14/2012 0938   K 4.2 12/14/2012 0938   CL 101 12/14/2012 0938   CO2 33* 12/14/2012 0938   GLUCOSE 93 12/14/2012 0938   BUN 14 12/14/2012 0938   CREATININE 0.90 12/14/2012 0938   CREATININE 0.84 06/30/2012 1033   CALCIUM 9.4 12/14/2012 0938   GFRNONAA 65 09/28/2012 0912   GFRNONAA 70* 06/30/2012 1033   GFRAA 76 09/28/2012 0912   GFRAA 82* 06/30/2012 1033    Lipid Panel     Component Value Date/Time   CHOL 184 12/14/2012 0938   TRIG 101 12/14/2012 0938   HDL 67 12/14/2012 0938   CHOLHDL 2.7 12/14/2012 0938   VLDL 20 12/14/2012 0938    LDLCALC 97 12/14/2012 0938    CBC    Component Value Date/Time   WBC 5.4 09/28/2012 0912   RBC 4.51 09/28/2012 0912   HGB 13.7 09/28/2012 0912   HCT 41.9 09/28/2012 0912   PLT 218 09/28/2012 0912   MCV 92.9 09/28/2012 0912   MCH 30.4 09/28/2012 0912   MCHC 32.7 09/28/2012 0912   RDW 13.1 09/28/2012 0912   LYMPHSABS 1.4 02/19/2008 2110   MONOABS 0.3 02/19/2008 2110   EOSABS 0.1 02/19/2008 2110   BASOSABS 0.0 02/19/2008 2110

## 2013-10-27 NOTE — Progress Notes (Signed)
Pt here to follow up with Dr. Daleen Squibb for chest pain radiating under left arm/breast with SB EKG,elevated blood pressure Pt denies chest pain today but states the pain is intermit with tightness Slight headache with dizziness noted Pt is taking Metoprolol 50 mg tablet

## 2013-11-10 ENCOUNTER — Ambulatory Visit: Payer: No Typology Code available for payment source | Attending: Cardiology | Admitting: *Deleted

## 2013-11-10 VITALS — BP 128/72 | Wt 120.4 lb

## 2013-11-10 DIAGNOSIS — I10 Essential (primary) hypertension: Secondary | ICD-10-CM | POA: Insufficient documentation

## 2013-11-10 NOTE — Progress Notes (Signed)
Patient is here today for a BP recheck , reading today is 128/72.

## 2013-11-10 NOTE — Progress Notes (Signed)
   Subjective:    Patient ID: Linda Hughes, female    DOB: February 17, 1946, 68 y.o.   MRN: [id number]  HPI    Review of Systems     Objective:   Physical Exam        Assessment & Plan:

## 2013-11-22 ENCOUNTER — Encounter: Payer: Self-pay | Admitting: Internal Medicine

## 2013-11-22 ENCOUNTER — Ambulatory Visit: Payer: No Typology Code available for payment source | Attending: Internal Medicine | Admitting: Internal Medicine

## 2013-11-22 VITALS — BP 133/80 | HR 59 | Temp 98.1°F | Resp 16 | Ht 60.0 in | Wt 121.0 lb

## 2013-11-22 DIAGNOSIS — I10 Essential (primary) hypertension: Secondary | ICD-10-CM | POA: Insufficient documentation

## 2013-11-22 DIAGNOSIS — E785 Hyperlipidemia, unspecified: Secondary | ICD-10-CM

## 2013-11-22 DIAGNOSIS — E7800 Pure hypercholesterolemia, unspecified: Secondary | ICD-10-CM | POA: Insufficient documentation

## 2013-11-22 LAB — CBC WITH DIFFERENTIAL/PLATELET
Basophils Absolute: 0.1 10*3/uL (ref 0.0–0.1)
Basophils Relative: 1 % (ref 0–1)
EOS ABS: 0.1 10*3/uL (ref 0.0–0.7)
Eosinophils Relative: 2 % (ref 0–5)
HCT: 41.1 % (ref 36.0–46.0)
Hemoglobin: 13.9 g/dL (ref 12.0–15.0)
LYMPHS ABS: 1.7 10*3/uL (ref 0.7–4.0)
Lymphocytes Relative: 28 % (ref 12–46)
MCH: 31.5 pg (ref 26.0–34.0)
MCHC: 33.8 g/dL (ref 30.0–36.0)
MCV: 93.2 fL (ref 78.0–100.0)
MONOS PCT: 8 % (ref 3–12)
Monocytes Absolute: 0.5 10*3/uL (ref 0.1–1.0)
NEUTROS PCT: 61 % (ref 43–77)
Neutro Abs: 3.7 10*3/uL (ref 1.7–7.7)
Platelets: 219 10*3/uL (ref 150–400)
RBC: 4.41 MIL/uL (ref 3.87–5.11)
RDW: 12.5 % (ref 11.5–15.5)
WBC: 6 10*3/uL (ref 4.0–10.5)

## 2013-11-22 LAB — POCT GLYCOSYLATED HEMOGLOBIN (HGB A1C): HEMOGLOBIN A1C: 5.9

## 2013-11-22 NOTE — Progress Notes (Signed)
Patient ID: Linda Hughes, female   DOB: November 11, 1945, 68 y.o.   MRN: [id number]   Linda Hughes, is a 68 y.o. female  WUJ:[id number]  [id number]  DOB - 1945-05-17  Chief Complaint  Patient presents with  . Follow-up        Subjective:   Linda Hughes is a 68 y.o. female here today for a follow up visit. Patient has history of hypertension, hypercholesterolemia and GERD here today for routine follow-up. His pain well at home. Compliant with medications. Reports no side effects. Patient does not need medication refills today. Patient has No headache, No chest pain, No abdominal pain - No Nausea, No new weakness tingling or numbness, No Cough - SOB.  No problems updated.  ALLERGIES: No Known Allergies  PAST MEDICAL HISTORY: Past Medical History  Diagnosis Date  . Hypertension   . High blood cholesterol   . GERD (gastroesophageal reflux disease)     MEDICATIONS AT HOME: Prior to Admission medications   Medication Sig Start Date End Date Taking? Authorizing Provider  amLODipine (NORVASC) 2.5 MG tablet Take 1 tablet (2.5 mg total) by mouth daily. 10/27/13  Yes Gaylord Shih, MD  calcium-vitamin D (OSCAL WITH D) 500-200 MG-UNIT per tablet Take 1 tablet by mouth daily with breakfast. 10/21/13  Yes Quentin Angst, MD  citalopram (CELEXA) 40 MG tablet Take 1 tablet (40 mg total) by mouth daily. 10/21/13  Yes Quentin Angst, MD  fish oil-omega-3 fatty acids 1000 MG capsule Take 1 g by mouth daily.   Yes Historical Provider, MD  metoprolol (LOPRESSOR) 50 MG tablet Take 1 tablet (50 mg total) by mouth 2 (two) times daily. 10/27/13  Yes Gaylord Shih, MD  Multiple Vitamin (MULTIVITAMIN WITH MINERALS) TABS tablet Take 1 tablet by mouth daily. 03/16/13  Yes Quentin Angst, MD  pravastatin (PRAVACHOL) 20 MG tablet Take 1 tablet (20 mg total) by mouth daily. 10/21/13  Yes Quentin Angst, MD     Objective:   Filed Vitals:   11/22/13 1446  BP: 133/80  Pulse: 59  Temp: 98.1 F  (36.7 C)  TempSrc: Oral  Resp: 16  Height: 5' (1.524 m)  Weight: 121 lb (54.885 kg)  SpO2: 99%    Exam General appearance : Awake, alert, not in any distress. Speech Clear. Not toxic looking HEENT: Atraumatic and Normocephalic, pupils equally reactive to light and accomodation Neck: supple, no JVD. No cervical lymphadenopathy.  Chest:Good air entry bilaterally, no added sounds  CVS: S1 S2 regular, no murmurs.  Abdomen: Bowel sounds present, Non tender and not distended with no gaurding, rigidity or rebound. Extremities: B/L Lower Ext shows no edema, both legs are warm to touch Neurology: Awake alert, and oriented X 3, CN II-XII intact, Non focal  Data Review No results found for this basename: HGBA1C     Assessment & Plan   1. Dyslipidemia  - Lipid panel - To address this please limit saturated fat to no more than 7% of your calories, limit cholesterol to 200 mg/day, increase fiber and exercise as tolerated. If needed we may add another cholesterol lowering medication to your regimen.   2. Unspecified essential hypertension  - CBC with Differential - COMPLETE METABOLIC PANEL WITH GFR - POCT glycosylated hemoglobin (Hb A1C) - TSH - Urinalysis, Complete   - We have discussed target BP range and blood pressure goal - I have advised patient to check BP regularly and to call us back or report to clinic if the numbers  are consistently higher than 140/90  - We discussed the importance of compliance with medical therapy and DASH diet recommended, consequences of uncontrolled hypertension discussed.  - continue current BP medications   Return in about 3 months (around 02/21/2014), or if symptoms worsen or fail to improve, for Follow up HTN, Follow up Pain and comorbidities.  The patient was given clear instructions to go to ER or return to medical center if symptoms don't improve, worsen or new problems develop. The patient verbalized understanding. The patient was told to call  to get lab results if they haven't heard anything in the next week.   This note has been created with Education officer, environmental. Any transcriptional errors are unintentional.    Linda Lewandowsky, MD, MHA, FACP, FAAP [facility] [place], [place] [phone]   11/22/2013, 3:43 PM

## 2013-11-22 NOTE — Progress Notes (Signed)
Pt is here following up on her HTN and hyperlipidemia.

## 2013-11-23 LAB — URINALYSIS, COMPLETE
Bacteria, UA: NONE SEEN
Bilirubin Urine: NEGATIVE
Casts: NONE SEEN
Crystals: NONE SEEN
Glucose, UA: NEGATIVE mg/dL
Hgb urine dipstick: NEGATIVE
Ketones, ur: NEGATIVE mg/dL
Nitrite: NEGATIVE
Protein, ur: NEGATIVE mg/dL
Specific Gravity, Urine: 1.01 (ref 1.005–1.030)
Squamous Epithelial / HPF: NONE SEEN
Urobilinogen, UA: 0.2 mg/dL (ref 0.0–1.0)
pH: 7 (ref 5.0–8.0)

## 2013-11-23 LAB — LIPID PANEL
Cholesterol: 169 mg/dL (ref 0–200)
HDL: 62 mg/dL
LDL Cholesterol: 83 mg/dL (ref 0–99)
Total CHOL/HDL Ratio: 2.7 ratio
Triglycerides: 122 mg/dL
VLDL: 24 mg/dL (ref 0–40)

## 2013-11-23 LAB — COMPLETE METABOLIC PANEL WITHOUT GFR
ALT: 61 U/L — ABNORMAL HIGH (ref 0–35)
AST: 57 U/L — ABNORMAL HIGH (ref 0–37)
Albumin: 4.3 g/dL (ref 3.5–5.2)
Alkaline Phosphatase: 77 U/L (ref 39–117)
BUN: 16 mg/dL (ref 6–23)
CO2: 31 meq/L (ref 19–32)
Calcium: 9.2 mg/dL (ref 8.4–10.5)
Chloride: 103 meq/L (ref 96–112)
Creat: 0.82 mg/dL (ref 0.50–1.10)
GFR, Est African American: 85 mL/min
GFR, Est Non African American: 74 mL/min
Glucose, Bld: 93 mg/dL (ref 70–99)
Potassium: 4.7 meq/L (ref 3.5–5.3)
Sodium: 140 meq/L (ref 135–145)
Total Bilirubin: 0.4 mg/dL (ref 0.2–1.2)
Total Protein: 6.8 g/dL (ref 6.0–8.3)

## 2013-11-23 LAB — TSH: TSH: 1.889 u[IU]/mL (ref 0.350–4.500)

## 2013-11-25 ENCOUNTER — Telehealth: Payer: Self-pay | Admitting: Emergency Medicine

## 2013-11-25 NOTE — Telephone Encounter (Signed)
Pt given lab results with instructions to avoid alcohol or Tylenol. Informed we will recheck blood work during next OV

## 2013-11-25 NOTE — Telephone Encounter (Signed)
Message copied by Linda Hughes on Thu Nov 25, 2013  5:12 PM ------      Message from: Linda Hughes      Created: Wed Nov 24, 2013 10:58 AM       Please inform patient that her laboratory tests result are mostly within normal limit except for her liver enzymes which are slightly elevated. Please avoided the use of alcohol and/or Tylenol, we will repeat the test during the next visit and if the level remains high but we'll evaluate further. ------

## 2013-12-31 ENCOUNTER — Ambulatory Visit: Payer: No Typology Code available for payment source | Attending: Internal Medicine

## 2014-01-14 ENCOUNTER — Ambulatory Visit: Payer: No Typology Code available for payment source

## 2014-01-21 ENCOUNTER — Ambulatory Visit: Payer: Self-pay | Attending: Internal Medicine

## 2014-02-21 ENCOUNTER — Encounter: Payer: Self-pay | Admitting: Internal Medicine

## 2014-02-21 ENCOUNTER — Ambulatory Visit: Payer: Self-pay | Attending: Internal Medicine | Admitting: Internal Medicine

## 2014-02-21 VITALS — BP 127/67 | HR 66 | Temp 98.0°F | Resp 16 | Ht 60.0 in | Wt 123.0 lb

## 2014-02-21 DIAGNOSIS — F411 Generalized anxiety disorder: Secondary | ICD-10-CM | POA: Insufficient documentation

## 2014-02-21 DIAGNOSIS — Z79899 Other long term (current) drug therapy: Secondary | ICD-10-CM | POA: Insufficient documentation

## 2014-02-21 DIAGNOSIS — I10 Essential (primary) hypertension: Secondary | ICD-10-CM | POA: Insufficient documentation

## 2014-02-21 DIAGNOSIS — E785 Hyperlipidemia, unspecified: Secondary | ICD-10-CM | POA: Insufficient documentation

## 2014-02-21 DIAGNOSIS — K219 Gastro-esophageal reflux disease without esophagitis: Secondary | ICD-10-CM | POA: Insufficient documentation

## 2014-02-21 DIAGNOSIS — R740 Nonspecific elevation of levels of transaminase and lactic acid dehydrogenase [LDH]: Secondary | ICD-10-CM | POA: Insufficient documentation

## 2014-02-21 DIAGNOSIS — F329 Major depressive disorder, single episode, unspecified: Secondary | ICD-10-CM | POA: Insufficient documentation

## 2014-02-21 DIAGNOSIS — R7401 Elevation of levels of liver transaminase levels: Secondary | ICD-10-CM | POA: Insufficient documentation

## 2014-02-21 LAB — HEPATIC FUNCTION PANEL
ALK PHOS: 82 U/L (ref 39–117)
ALT: 42 U/L — AB (ref 0–35)
AST: 40 U/L — AB (ref 0–37)
Albumin: 4.3 g/dL (ref 3.5–5.2)
BILIRUBIN DIRECT: 0.1 mg/dL (ref 0.0–0.3)
BILIRUBIN INDIRECT: 0.3 mg/dL (ref 0.2–1.2)
TOTAL PROTEIN: 7 g/dL (ref 6.0–8.3)
Total Bilirubin: 0.4 mg/dL (ref 0.2–1.2)

## 2014-02-21 LAB — HEPATITIS PANEL, ACUTE
HCV Ab: NEGATIVE
Hep A IgM: NONREACTIVE
Hep B C IgM: NONREACTIVE
Hepatitis B Surface Ag: NEGATIVE

## 2014-02-21 MED ORDER — METOPROLOL TARTRATE 50 MG PO TABS: 50.0000 mg | ORAL_TABLET | Freq: Two times a day (BID) | ORAL | Status: DC

## 2014-02-21 MED ORDER — CITALOPRAM HYDROBROMIDE 40 MG PO TABS: 40.0000 mg | ORAL_TABLET | Freq: Every day | ORAL | Status: DC

## 2014-02-21 MED ORDER — PRAVASTATIN SODIUM 20 MG PO TABS: 20.0000 mg | ORAL_TABLET | Freq: Every day | ORAL | Status: DC

## 2014-02-21 NOTE — Patient Instructions (Signed)
Hypertension Hypertension, commonly called high blood pressure, is when the force of blood pumping through your arteries is too strong. Your arteries are the blood vessels that carry blood from your heart throughout your body. A blood pressure reading consists of a higher number over a lower number, such as 110/72. The higher number (systolic) is the pressure inside your arteries when your heart pumps. The lower number (diastolic) is the pressure inside your arteries when your heart relaxes. Ideally you want your blood pressure below 120/80. Hypertension forces your heart to work harder to pump blood. Your arteries may become narrow or stiff. Having hypertension puts you at risk for heart disease, stroke, and other problems.  RISK FACTORS Some risk factors for high blood pressure are controllable. Others are not.  Risk factors you cannot control include:   Race. You may be at higher risk if you are African American.  Age. Risk increases with age.  Gender. Men are at higher risk than women before age 45 years. After age 65, women are at higher risk than men. Risk factors you can control include:  Not getting enough exercise or physical activity.  Being overweight.  Getting too much fat, sugar, calories, or salt in your diet.  Drinking too much alcohol. SIGNS AND SYMPTOMS Hypertension does not usually cause signs or symptoms. Extremely high blood pressure (hypertensive crisis) may cause headache, anxiety, shortness of breath, and nosebleed. DIAGNOSIS  To check if you have hypertension, your health care provider will measure your blood pressure while you are seated, with your arm held at the level of your heart. It should be measured at least twice using the same arm. Certain conditions can cause a difference in blood pressure between your right and left arms. A blood pressure reading that is higher than normal on one occasion does not mean that you need treatment. If one blood pressure reading  is high, ask your health care provider about having it checked again. TREATMENT  Treating high blood pressure includes making lifestyle changes and possibly taking medicine. Living a healthy lifestyle can help lower high blood pressure. You may need to change some of your habits. Lifestyle changes may include:  Following the DASH diet. This diet is high in fruits, vegetables, and whole grains. It is low in salt, red meat, and added sugars.  Getting at least 2 hours of brisk physical activity every week.  Losing weight if necessary.  Not smoking.  Limiting alcoholic beverages.  Learning ways to reduce stress. If lifestyle changes are not enough to get your blood pressure under control, your health care provider may prescribe medicine. You may need to take more than one. Work closely with your health care provider to understand the risks and benefits. HOME CARE INSTRUCTIONS  Have your blood pressure rechecked as directed by your health care provider.   Take medicines only as directed by your health care provider. Follow the directions carefully. Blood pressure medicines must be taken as prescribed. The medicine does not work as well when you skip doses. Skipping doses also puts you at risk for problems.   Do not smoke.   Monitor your blood pressure at home as directed by your health care provider. SEEK MEDICAL CARE IF:   You think you are having a reaction to medicines taken.  You have recurrent headaches or feel dizzy.  You have swelling in your ankles.  You have trouble with your vision. SEEK IMMEDIATE MEDICAL CARE IF:  You develop a severe headache or confusion.    You have unusual weakness, numbness, or feel faint.  You have severe chest or abdominal pain.  You vomit repeatedly.  You have trouble breathing. MAKE SURE YOU:   Understand these instructions.  Will watch your condition.  Will get help right away if you are not doing well or get worse. Document  Released: 02/18/2005 Document Revised: 07/05/2013 Document Reviewed: 12/11/2012 ExitCare Patient Information 2015 [employer]. This information is not intended to replace advice given to you by your health care provider. Make sure you discuss any questions you have with your health care provider. DASH Eating Plan DASH stands for "Dietary Approaches to Stop Hypertension." The DASH eating plan is a healthy eating plan that has been shown to reduce high blood pressure (hypertension). Additional health benefits may include reducing the risk of type 2 diabetes mellitus, heart disease, and stroke. The DASH eating plan may also help with weight loss. WHAT DO I NEED TO KNOW ABOUT THE DASH EATING PLAN? For the DASH eating plan, you will follow these general guidelines:  Choose foods with a percent daily value for sodium of less than 5% (as listed on the food label).  Use salt-free seasonings or herbs instead of table salt or sea salt.  Check with your health care provider or pharmacist before using salt substitutes.  Eat lower-sodium products, often labeled as "lower sodium" or "no salt added."  Eat fresh foods.  Eat more vegetables, fruits, and low-fat dairy products.  Choose whole grains. Look for the word "whole" as the first word in the ingredient list.  Choose fish and skinless chicken or turkey more often than red meat. Limit fish, poultry, and meat to 6 oz (170 g) each day.  Limit sweets, desserts, sugars, and sugary drinks.  Choose heart-healthy fats.  Limit cheese to 1 oz (28 g) per day.  Eat more home-cooked food and less restaurant, buffet, and fast food.  Limit fried foods.  Cook foods using methods other than frying.  Limit canned vegetables. If you do use them, rinse them well to decrease the sodium.  When eating at a restaurant, ask that your food be prepared with less salt, or no salt if possible. WHAT FOODS CAN I EAT? Seek help from a dietitian for individual  calorie needs. Grains Whole grain or whole wheat bread. Brown rice. Whole grain or whole wheat pasta. Quinoa, bulgur, and whole grain cereals. Low-sodium cereals. Corn or whole wheat flour tortillas. Whole grain cornbread. Whole grain crackers. Low-sodium crackers. Vegetables Fresh or frozen vegetables (raw, steamed, roasted, or grilled). Low-sodium or reduced-sodium tomato and vegetable juices. Low-sodium or reduced-sodium tomato sauce and paste. Low-sodium or reduced-sodium canned vegetables.  Fruits All fresh, canned (in natural juice), or frozen fruits. Meat and Other Protein Products Ground beef (85% or leaner), grass-fed beef, or beef trimmed of fat. Skinless chicken or turkey. Ground chicken or turkey. Pork trimmed of fat. All fish and seafood. Eggs. Dried beans, peas, or lentils. Unsalted nuts and seeds. Unsalted canned beans. Dairy Low-fat dairy products, such as skim or 1% milk, 2% or reduced-fat cheeses, low-fat ricotta or cottage cheese, or plain low-fat yogurt. Low-sodium or reduced-sodium cheeses. Fats and Oils Tub margarines without trans fats. Light or reduced-fat mayonnaise and salad dressings (reduced sodium). Avocado. Safflower, olive, or canola oils. Natural peanut or almond butter. Other Unsalted popcorn and pretzels. The items listed above may not be a complete list of recommended foods or beverages. Contact your dietitian for more options. WHAT FOODS ARE NOT RECOMMENDED? Grains White bread.   White pasta. White rice. Refined cornbread. Bagels and croissants. Crackers that contain trans fat. Vegetables Creamed or fried vegetables. Vegetables in a cheese sauce. Regular canned vegetables. Regular canned tomato sauce and paste. Regular tomato and vegetable juices. Fruits Dried fruits. Canned fruit in light or heavy syrup. Fruit juice. Meat and Other Protein Products Fatty cuts of meat. Ribs, chicken wings, bacon, sausage, bologna, salami, chitterlings, fatback, hot dogs,  bratwurst, and packaged luncheon meats. Salted nuts and seeds. Canned beans with salt. Dairy Whole or 2% milk, cream, half-and-half, and cream cheese. Whole-fat or sweetened yogurt. Full-fat cheeses or blue cheese. Nondairy creamers and whipped toppings. Processed cheese, cheese spreads, or cheese curds. Condiments Onion and garlic salt, seasoned salt, table salt, and sea salt. Canned and packaged gravies. Worcestershire sauce. Tartar sauce. Barbecue sauce. Teriyaki sauce. Soy sauce, including reduced sodium. Steak sauce. Fish sauce. Oyster sauce. Cocktail sauce. Horseradish. Ketchup and mustard. Meat flavorings and tenderizers. Bouillon cubes. Hot sauce. Tabasco sauce. Marinades. Taco seasonings. Relishes. Fats and Oils Butter, stick margarine, lard, shortening, ghee, and bacon fat. Coconut, palm kernel, or palm oils. Regular salad dressings. Other Pickles and olives. Salted popcorn and pretzels. The items listed above may not be a complete list of foods and beverages to avoid. Contact your dietitian for more information. WHERE CAN I FIND MORE INFORMATION? National Heart, Lung, and Blood Institute: www.nhlbi.nih.gov/health/health-topics/topics/dash/ Document Released: 02/07/2011 Document Revised: 07/05/2013 Document Reviewed: 12/23/2012 ExitCare Patient Information 2015 [employer]. This information is not intended to replace advice given to you by your health care provider. Make sure you discuss any questions you have with your health care provider. Basic Carbohydrate Counting for Diabetes Mellitus Carbohydrate counting is a method for keeping track of the amount of carbohydrates you eat. Eating carbohydrates naturally increases the level of sugar (glucose) in your blood, so it is important for you to know the amount that is okay for you to have in every meal. Carbohydrate counting helps keep the level of glucose in your blood within normal limits. The amount of carbohydrates allowed is  different for every person. A dietitian can help you calculate the amount that is right for you. Once you know the amount of carbohydrates you can have, you can count the carbohydrates in the foods you want to eat. Carbohydrates are found in the following foods:  Grains, such as breads and cereals.  Dried beans and soy products.  Starchy vegetables, such as potatoes, peas, and corn.  Fruit and fruit juices.  Milk and yogurt.  Sweets and snack foods, such as cake, cookies, candy, chips, soft drinks, and fruit drinks. CARBOHYDRATE COUNTING There are two ways to count the carbohydrates in your food. You can use either of the methods or a combination of both. Reading the "Nutrition Facts" on Packaged Food The "Nutrition Facts" is an area that is included on the labels of almost all packaged food and beverages in the [place]. It includes the serving size of that food or beverage and information about the nutrients in each serving of the food, including the grams (g) of carbohydrate per serving.  Decide the number of servings of this food or beverage that you will be able to eat or drink. Multiply that number of servings by the number of grams of carbohydrate that is listed on the label for that serving. The total will be the amount of carbohydrates you will be having when you eat or drink this food or beverage. Learning Standard Serving Sizes of Food When you eat   food that is not packaged or does not include "Nutrition Facts" on the label, you need to measure the servings in order to count the amount of carbohydrates.A serving of most carbohydrate-rich foods contains about 15 g of carbohydrates. The following list includes serving sizes of carbohydrate-rich foods that provide 15 g ofcarbohydrate per serving:   1 slice of bread (1 oz) or 1 six-inch tortilla.    of a hamburger bun or English muffin.  4-6 crackers.   cup unsweetened dry cereal.    cup hot cereal.   cup rice or  pasta.    cup mashed potatoes or  of a large baked potato.  1 cup fresh fruit or one small piece of fruit.    cup canned or frozen fruit or fruit juice.  1 cup milk.   cup plain fat-free yogurt or yogurt sweetened with artificial sweeteners.   cup cooked dried beans or starchy vegetable, such as peas, corn, or potatoes.  Decide the number of standard-size servings that you will eat. Multiply that number of servings by 15 (the grams of carbohydrates in that serving). For example, if you eat 2 cups of strawberries, you will have eaten 2 servings and 30 g of carbohydrates (2 servings x 15 g = 30 g). For foods such as soups and casseroles, in which more than one food is mixed in, you will need to count the carbohydrates in each food that is included. EXAMPLE OF CARBOHYDRATE COUNTING Sample Dinner  3 oz chicken breast.   cup of brown rice.   cup of corn.  1 cup milk.   1 cup strawberries with sugar-free whipped topping.  Carbohydrate Calculation Step 1: Identify the foods that contain carbohydrates:   Rice.   Corn.   Milk.   Strawberries. Step 2:Calculate the number of servings eaten of each:   2 servings of rice.   1 serving of corn.   1 serving of milk.   1 serving of strawberries. Step 3: Multiply each of those number of servings by 15 g:   2 servings of rice x 15 g = 30 g.   1 serving of corn x 15 g = 15 g.   1 serving of milk x 15 g = 15 g.   1 serving of strawberries x 15 g = 15 g. Step 4: Add together all of the amounts to find the total grams of carbohydrates eaten: 30 g + 15 g + 15 g + 15 g = 75 g. Document Released: 02/18/2005 Document Revised: 07/05/2013 Document Reviewed: 01/15/2013 ExitCare Patient Information 2015 [employer]. This information is not intended to replace advice given to you by your health care provider. Make sure you discuss any questions you have with your health care provider.  

## 2014-02-21 NOTE — Progress Notes (Signed)
Pt is here today following up on her HTN. Pt wants to review her lab results.

## 2014-02-21 NOTE — Progress Notes (Signed)
Patient ID: Linda Hughes, female   DOB: 04-01-1945, 68 y.o.   MRN: [id number]   Linda Hughes, is a 68 y.o. female  [id number]  [id number]  DOB - 04-01-1945  Chief Complaint  Patient presents with  . Follow-up        Subjective:   Linda Hughes is a 68 y.o. female here today for a follow up visit. Patient has history of hypertension, dyslipidemia, GERD and major depression here today for routine follow-up visit. Her lab results shows mild transaminasemia in September, patient has had to review the results again and to possibly follow up on that test. She has no significant complaint today. Her blood pressure is controlled. She reports no side effects to medication. She is now only on metoprolol 50 mg tablet by mouth twice a day, she is not taking Norvasc. Patient does not smoke cigarettes, she does not drink alcohol. Patient has No headache, No chest pain, No abdominal pain - No Nausea, No new weakness tingling or numbness, No Cough - SOB.  Problem  Transaminasemia    ALLERGIES: No Known Allergies  PAST MEDICAL HISTORY: Past Medical History  Diagnosis Date  . Hypertension   . High blood cholesterol   . GERD (gastroesophageal reflux disease)     MEDICATIONS AT HOME: Prior to Admission medications   Medication Sig Start Date End Date Taking? Authorizing Provider  calcium-vitamin D (OSCAL WITH D) 500-200 MG-UNIT per tablet Take 1 tablet by mouth daily with breakfast. 10/21/13  Yes Quentin Angstlugbemiga E Dayron Odland, MD  citalopram (CELEXA) 40 MG tablet Take 1 tablet (40 mg total) by mouth daily. 02/21/14  Yes Quentin Angstlugbemiga E Deckard Stuber, MD  fish oil-omega-3 fatty acids 1000 MG capsule Take 1 g by mouth daily.   Yes Historical Provider, MD  metoprolol (LOPRESSOR) 50 MG tablet Take 1 tablet (50 mg total) by mouth 2 (two) times daily. 02/21/14  Yes Quentin Angstlugbemiga E Aariv Medlock, MD  Multiple Vitamin (MULTIVITAMIN WITH MINERALS) TABS tablet Take 1 tablet by mouth daily. 03/16/13  Yes Quentin Angstlugbemiga E Shalayna Ornstein, MD    pravastatin (PRAVACHOL) 20 MG tablet Take 1 tablet (20 mg total) by mouth daily. 02/21/14  Yes Quentin Angstlugbemiga E Makalynn Berwanger, MD     Objective:   Filed Vitals:   02/21/14 0916  BP: 127/67  Pulse: 66  Temp: 98 F (36.7 C)  TempSrc: Oral  Resp: 16  Height: 5' (1.524 m)  Weight: 123 lb (55.792 kg)  SpO2: 93%    Exam General appearance : Awake, alert, not in any distress. Speech Clear. Not toxic looking HEENT: Atraumatic and Normocephalic, pupils equally reactive to light and accomodation Neck: supple, no JVD. No cervical lymphadenopathy.  Chest:Good air entry bilaterally, no added sounds  CVS: S1 S2 regular, no murmurs.  Abdomen: Bowel sounds present, Non tender and not distended with no gaurding, rigidity or rebound. Extremities: B/L Lower Ext shows no edema, both legs are warm to touch Neurology: Awake alert, and oriented X 3, CN II-XII intact, Non focal Skin:No Rash Wounds:N/A  Data Review Lab Results  Component Value Date   HGBA1C 5.9 11/22/2013     Assessment & Plan   1. Transaminasemia  - Hepatic Function Panel - Hepatitis panel, acute  2. Dyslipidemia  - pravastatin (PRAVACHOL) 20 MG tablet; Take 1 tablet (20 mg total) by mouth daily.  Dispense: 90 tablet; Refill: 3  3. Generalized anxiety disorder  - citalopram (CELEXA) 40 MG tablet; Take 1 tablet (40 mg total) by mouth daily.  Dispense: 90 tablet; Refill: 3  4. Essential hypertension: Controlled  - metoprolol (LOPRESSOR) 50 MG tablet; Take 1 tablet (50 mg total) by mouth 2 (two) times daily.  Dispense: 180 tablet; Refill: 3  - DASH diet  Discontinue Norvasc since blood pressure is controlled on metoprolol only  Patient was counseled extensively about nutrition and exercise  Return in about 6 months (around 08/23/2014), or if symptoms worsen or fail to improve, for Follow up HTN.  The patient was given clear instructions to go to ER or return to medical center if symptoms don't improve, worsen or new  problems develop. The patient verbalized understanding. The patient was told to call to get lab results if they haven't heard anything in the next week.   This note has been created with Education officer, environmentalDragon speech recognition software and smart phrase technology. Any transcriptional errors are unintentional.    Jeanann LewandowskyJEGEDE, Philis Doke, MD, MHA, FACP, FAAP [facility] [place], [place] [phone]   02/21/2014, 10:24 AM

## 2014-03-10 ENCOUNTER — Telehealth: Payer: Self-pay | Admitting: Emergency Medicine

## 2014-03-10 NOTE — Telephone Encounter (Signed)
-----   Message from Quentin Angstlugbemiga E Jegede, MD sent at 02/27/2014  2:55 PM EST ----- Please inform patient that her liver function test shows slight improvement over the previous results, there is no evidence of hepatitis. We will continue to monitor her liver function. Advised patient to avoid alcohol and Tylenol

## 2014-03-10 NOTE — Telephone Encounter (Signed)
Pt given lab results with instructions to refrain from alcohol and Tylenol Pt informed Liver function slightly improved

## 2014-07-22 ENCOUNTER — Ambulatory Visit: Payer: Self-pay

## 2014-08-02 ENCOUNTER — Ambulatory Visit: Payer: Self-pay | Attending: Internal Medicine

## 2014-08-05 ENCOUNTER — Telehealth: Payer: Self-pay | Admitting: Internal Medicine

## 2014-08-05 NOTE — Telephone Encounter (Signed)
Patient has come in today to complete financial assistance requirements and wanted to request medication refills on her medications; patient has an upcoming appointment but does not have enough medication to last; please call to consult with patient as [facility];

## 2014-08-18 ENCOUNTER — Encounter: Payer: Self-pay | Admitting: Internal Medicine

## 2014-08-18 ENCOUNTER — Ambulatory Visit: Payer: Self-pay | Attending: Internal Medicine | Admitting: Internal Medicine

## 2014-08-18 VITALS — BP 128/83 | HR 55 | Temp 97.5°F | Resp 18 | Ht 61.0 in | Wt 119.0 lb

## 2014-08-18 DIAGNOSIS — R7401 Elevation of levels of liver transaminase levels: Secondary | ICD-10-CM

## 2014-08-18 DIAGNOSIS — Z0000 Encounter for general adult medical examination without abnormal findings: Secondary | ICD-10-CM

## 2014-08-18 DIAGNOSIS — I10 Essential (primary) hypertension: Secondary | ICD-10-CM

## 2014-08-18 DIAGNOSIS — F411 Generalized anxiety disorder: Secondary | ICD-10-CM

## 2014-08-18 DIAGNOSIS — E785 Hyperlipidemia, unspecified: Secondary | ICD-10-CM

## 2014-08-18 DIAGNOSIS — R740 Nonspecific elevation of levels of transaminase and lactic acid dehydrogenase [LDH]: Secondary | ICD-10-CM

## 2014-08-18 LAB — HEPATIC FUNCTION PANEL
ALT: 28 U/L (ref 0–35)
AST: 37 U/L (ref 0–37)
Albumin: 4.2 g/dL (ref 3.5–5.2)
Alkaline Phosphatase: 65 U/L (ref 39–117)
BILIRUBIN INDIRECT: 0.6 mg/dL (ref 0.2–1.2)
BILIRUBIN TOTAL: 0.7 mg/dL (ref 0.2–1.2)
Bilirubin, Direct: 0.1 mg/dL (ref 0.0–0.3)
Total Protein: 6.9 g/dL (ref 6.0–8.3)

## 2014-08-18 LAB — POCT GLYCOSYLATED HEMOGLOBIN (HGB A1C): HEMOGLOBIN A1C: 5.6

## 2014-08-18 LAB — LIPID PANEL
CHOLESTEROL: 168 mg/dL (ref 0–200)
HDL: 69 mg/dL (ref >=46)
LDL Cholesterol: 81 mg/dL (ref 0–99)
Total CHOL/HDL Ratio: 2.4 Ratio
Triglycerides: 91 mg/dL (ref <150)
VLDL: 18 mg/dL (ref 0–40)

## 2014-08-18 MED ORDER — PRAVASTATIN SODIUM 20 MG PO TABS: 20.0000 mg | ORAL_TABLET | Freq: Every day | ORAL | Status: DC

## 2014-08-18 MED ORDER — CITALOPRAM HYDROBROMIDE 40 MG PO TABS: 40.0000 mg | ORAL_TABLET | Freq: Every day | ORAL | Status: DC

## 2014-08-18 MED ORDER — METOPROLOL TARTRATE 50 MG PO TABS: 50.0000 mg | ORAL_TABLET | Freq: Two times a day (BID) | ORAL | Status: DC

## 2014-08-18 NOTE — Progress Notes (Signed)
Patient here for follow up. Patient reports that she feels fine today. Patient did not eat breakfast this morning. Patient denies pain today. Patient took all medications this morning including BP and cholesterol meds. Patient needs refills on all medications. Patient would like a referral to receive a bone density scan.

## 2014-08-18 NOTE — Patient Instructions (Signed)
DASH Eating Plan DASH stands for "Dietary Approaches to Stop Hypertension." The DASH eating plan is a healthy eating plan that has been shown to reduce high blood pressure (hypertension). Additional health benefits may include reducing the risk of type 2 diabetes mellitus, heart disease, and stroke. The DASH eating plan may also help with weight loss. WHAT DO I NEED TO KNOW ABOUT THE DASH EATING PLAN? For the DASH eating plan, you will follow these general guidelines:  Choose foods with a percent daily value for sodium of less than 5% (as listed on the food label).  Use salt-free seasonings or herbs instead of table salt or sea salt.  Check with your health care provider or pharmacist before using salt substitutes.  Eat lower-sodium products, often labeled as "lower sodium" or "no salt added."  Eat fresh foods.  Eat more vegetables, fruits, and low-fat dairy products.  Choose whole grains. Look for the word "whole" as the first word in the ingredient list.  Choose fish and skinless chicken or turkey more often than red meat. Limit fish, poultry, and meat to 6 oz (170 g) each day.  Limit sweets, desserts, sugars, and sugary drinks.  Choose heart-healthy fats.  Limit cheese to 1 oz (28 g) per day.  Eat more home-cooked food and less restaurant, buffet, and fast food.  Limit fried foods.  Cook foods using methods other than frying.  Limit canned vegetables. If you do use them, rinse them well to decrease the sodium.  When eating at a restaurant, ask that your food be prepared with less salt, or no salt if possible. WHAT FOODS CAN I EAT? Seek help from a dietitian for individual calorie needs. Grains Whole grain or whole wheat bread. Brown rice. Whole grain or whole wheat pasta. Quinoa, bulgur, and whole grain cereals. Low-sodium cereals. Corn or whole wheat flour tortillas. Whole grain cornbread. Whole grain crackers. Low-sodium crackers. Vegetables Fresh or frozen vegetables  (raw, steamed, roasted, or grilled). Low-sodium or reduced-sodium tomato and vegetable juices. Low-sodium or reduced-sodium tomato sauce and paste. Low-sodium or reduced-sodium canned vegetables.  Fruits All fresh, canned (in natural juice), or frozen fruits. Meat and Other Protein Products Ground beef (85% or leaner), grass-fed beef, or beef trimmed of fat. Skinless chicken or turkey. Ground chicken or turkey. Pork trimmed of fat. All fish and seafood. Eggs. Dried beans, peas, or lentils. Unsalted nuts and seeds. Unsalted canned beans. Dairy Low-fat dairy products, such as skim or 1% milk, 2% or reduced-fat cheeses, low-fat ricotta or cottage cheese, or plain low-fat yogurt. Low-sodium or reduced-sodium cheeses. Fats and Oils Tub margarines without trans fats. Light or reduced-fat mayonnaise and salad dressings (reduced sodium). Avocado. Safflower, olive, or canola oils. Natural peanut or almond butter. Other Unsalted popcorn and pretzels. The items listed above may not be a complete list of recommended foods or beverages. Contact your dietitian for more options. WHAT FOODS ARE NOT RECOMMENDED? Grains White bread. White pasta. White rice. Refined cornbread. Bagels and croissants. Crackers that contain trans fat. Vegetables Creamed or fried vegetables. Vegetables in a cheese sauce. Regular canned vegetables. Regular canned tomato sauce and paste. Regular tomato and vegetable juices. Fruits Dried fruits. Canned fruit in light or heavy syrup. Fruit juice. Meat and Other Protein Products Fatty cuts of meat. Ribs, chicken wings, bacon, sausage, bologna, salami, chitterlings, fatback, hot dogs, bratwurst, and packaged luncheon meats. Salted nuts and seeds. Canned beans with salt. Dairy Whole or 2% milk, cream, half-and-half, and cream cheese. Whole-fat or sweetened yogurt. Full-fat   cheeses or blue cheese. Nondairy creamers and whipped toppings. Processed cheese, cheese spreads, or cheese  curds. Condiments Onion and garlic salt, seasoned salt, table salt, and sea salt. Canned and packaged gravies. Worcestershire sauce. Tartar sauce. Barbecue sauce. Teriyaki sauce. Soy sauce, including reduced sodium. Steak sauce. Fish sauce. Oyster sauce. Cocktail sauce. Horseradish. Ketchup and mustard. Meat flavorings and tenderizers. Bouillon cubes. Hot sauce. Tabasco sauce. Marinades. Taco seasonings. Relishes. Fats and Oils Butter, stick margarine, lard, shortening, ghee, and bacon fat. Coconut, palm kernel, or palm oils. Regular salad dressings. Other Pickles and olives. Salted popcorn and pretzels. The items listed above may not be a complete list of foods and beverages to avoid. Contact your dietitian for more information. WHERE CAN I FIND MORE INFORMATION? National Heart, [employer], [employer]: www.nhlbi.nih.gov/health/health-topics/topics/dash/ Document Released: 02/07/2011 Document Revised: 07/05/2013 Document Reviewed: 12/23/2012 ExitCare Patient Information 2015 [employer]. This information is not intended to replace advice given to you by your health care provider. Make sure you discuss any questions you have with your health care provider. Hypertension Hypertension, commonly called high blood pressure, is when the force of blood pumping through your arteries is too strong. Your arteries are the blood vessels that carry blood from your heart throughout your body. A blood pressure reading consists of a higher number over a lower number, such as 110/72. The higher number (systolic) is the pressure inside your arteries when your heart pumps. The lower number (diastolic) is the pressure inside your arteries when your heart relaxes. Ideally you want your blood pressure below 120/80. Hypertension forces your heart to work harder to pump blood. Your arteries may become narrow or stiff. Having hypertension puts you at risk for heart disease, stroke, and other problems.  RISK  FACTORS Some risk factors for high blood pressure are controllable. Others are not.  Risk factors you cannot control include:   Race. You may be at higher risk if you are African American.  Age. Risk increases with age.  Gender. Men are at higher risk than women before age 45 years. After age 65, women are at higher risk than men. Risk factors you can control include:  Not getting enough exercise or physical activity.  Being overweight.  Getting too much fat, sugar, calories, or salt in your diet.  Drinking too much alcohol. SIGNS AND SYMPTOMS Hypertension does not usually cause signs or symptoms. Extremely high blood pressure (hypertensive crisis) may cause headache, anxiety, shortness of breath, and nosebleed. DIAGNOSIS  To check if you have hypertension, your health care provider will measure your blood pressure while you are seated, with your arm held at the level of your heart. It should be measured at least twice using the same arm. Certain conditions can cause a difference in blood pressure between your right and left arms. A blood pressure reading that is higher than normal on one occasion does not mean that you need treatment. If one blood pressure reading is high, ask your health care provider about having it checked again. TREATMENT  Treating high blood pressure includes making lifestyle changes and possibly taking medicine. Living a healthy lifestyle can help lower high blood pressure. You may need to change some of your habits. Lifestyle changes may include:  Following the DASH diet. This diet is high in fruits, vegetables, and whole grains. It is low in salt, red meat, and added sugars.  Getting at least 2 hours of brisk physical activity every week.  Losing weight if necessary.  Not smoking.  Limiting   alcoholic beverages.  Learning ways to reduce stress. If lifestyle changes are not enough to get your blood pressure under control, your health care provider may  prescribe medicine. You may need to take more than one. Work closely with your health care provider to understand the risks and benefits. HOME CARE INSTRUCTIONS  Have your blood pressure rechecked as directed by your health care provider.   Take medicines only as directed by your health care provider. Follow the directions carefully. Blood pressure medicines must be taken as prescribed. The medicine does not work as well when you skip doses. Skipping doses also puts you at risk for problems.   Do not smoke.   Monitor your blood pressure at home as directed by your health care provider. SEEK MEDICAL CARE IF:   You think you are having a reaction to medicines taken.  You have recurrent headaches or feel dizzy.  You have swelling in your ankles.  You have trouble with your vision. SEEK IMMEDIATE MEDICAL CARE IF:  You develop a severe headache or confusion.  You have unusual weakness, numbness, or feel faint.  You have severe chest or abdominal pain.  You vomit repeatedly.  You have trouble breathing. MAKE SURE YOU:   Understand these instructions.  Will watch your condition.  Will get help right away if you are not doing well or get worse. Document Released: 02/18/2005 Document Revised: 07/05/2013 Document Reviewed: 12/11/2012 ExitCare Patient Information 2015 [employer]. This information is not intended to replace advice given to you by your health care provider. Make sure you discuss any questions you have with your health care provider. Dyslipidemia Dyslipidemia is an imbalance of the lipids in your blood. Lipids are waxy, fat-like proteins that your body needs in small amounts. Dyslipidemia often involves the lipids cholesterol or triglycerides. Common forms of dyslipidemia are:  High levels of bad cholesterol (LDL cholesterol). LDL cholesterol is the type of cholesterol that causes heart disease.  Low levels of good cholesterol (HDL cholesterol). HDL cholesterol  is the type of cholesterol that helps protect against heart disease.  High levels of triglycerides. Triglycerides are a fatty substance in the blood linked to a buildup of plaque on your arteries. RISK FACTORS  Increased age.  Having a family history of high cholesterol.  Certain medicines, including birth control pills, diuretics, beta-blockers, and some medicines for depression.  Smoking.  Eating a high-fat diet.  Being overweight.  Medical conditions such as diabetes, polycystic ovary syndrome, pregnancy, kidney disease, and hypothyroidism.  Lack of regular exercise. SIGNS AND SYMPTOMS There are no signs or symptoms with dyslipidemia.  DIAGNOSIS  A simple blood test called a fasting blood test can be done to determine your level of:  Total cholesterol. This is the combined number of LDL cholesterol and HDL cholesterol. A healthy number is lower than 200.  LDL cholesterol. The goal number for LDL cholesterol is different for each person depending on risk factors. Ask your health care provider what your LDL cholesterol number should be.  HDL cholesterol. A healthy level of HDL cholesterol is 60 or higher. A number lower than 40 for men or 50 for women is a danger sign.  Triglycerides. A healthy triglyceride number is less than 150. TREATMENT  Dyslipidemia is a treatable condition. Your health care provider will advise you on what type of treatment is best based on your age, your test results, and current guidelines. Treatment may include:   Dietary changes. A dietitian can help you create a meal   plan. You may need to:  Eat more foods that contain omega-3s, such as salmon and other fish.  Replace saturated fats and trans fats in your diet with healthy fats such as nuts, seeds, avocados, olive oil, and canola oil.  Regular exercise. This can help lower your LDL cholesterol, raise your HDL cholesterol, and help with weight management. Check with your health care provider before  beginning an exercise program. Most people should participate in 30 minutes of brisk exercise 5 days a week.  Quitting smoking.  Medicines to lower LDL cholesterol and triglycerides. Your health care provider will monitor your lipid levels with regular blood tests. HOME CARE INSTRUCTIONS  Eat a healthy diet. Follow any diet instructions if they were given to you by your health care provider.  Maintain a healthy weight.  Exercise regularly based on the recommendations of your health care provider.  Do not use any tobacco products, including cigarettes, chewing tobacco, or electronic cigarettes.  Take medicines only as directed by your health care provider.  Keep all follow-up visits as directed by your health care provider. SEEK MEDICAL CARE IF: You are having possible side effects from your medicines. Document Released: 02/23/2013 Document Revised: 07/05/2013 Document Reviewed: 02/23/2013 ExitCare Patient Information 2015 [employer]. This information is not intended to replace advice given to you by your health care provider. Make sure you discuss any questions you have with your health care provider.  

## 2014-08-18 NOTE — Progress Notes (Signed)
Patient ID: Linda Hughes, female   DOB: [birth year], 69 y.o.   MRN: [id number]   Linda Hughes, is a 69 y.o. female  [id number]  [id number]  DOB - 23-Jun-1945  Chief Complaint  Patient presents with  . Follow-up        Subjective:   Linda Hughes is a 69 y.o. female here today for a follow up visit. Very pleasant woman with history of hypertension, dyslipidemia, GERD, major depression and transaminasemia came to the clinic today for follow-up and repeat labs. She has no new complaints. She will like to have a bone density test done. Her blood pressure is controlled. She is compliant with medications. She does not smoke cigarettes, she does not drink alcohol. Patient has No headache, No chest pain, No abdominal pain - No Nausea, No new weakness tingling or numbness, No Cough - SOB.  Problem  Essential Hypertension    ALLERGIES: No Known Allergies  PAST MEDICAL HISTORY: Past Medical History  Diagnosis Date  . Hypertension   . High blood cholesterol   . GERD (gastroesophageal reflux disease)     MEDICATIONS AT HOME: Prior to Admission medications   Medication Sig Start Date End Date Taking? Authorizing Provider  calcium-vitamin D (OSCAL WITH D) 500-200 MG-UNIT per tablet Take 1 tablet by mouth daily with breakfast. 10/21/13  Yes Quentin Angst, MD  citalopram (CELEXA) 40 MG tablet Take 1 tablet (40 mg total) by mouth daily. 08/18/14  Yes Quentin Angst, MD  fish oil-omega-3 fatty acids 1000 MG capsule Take 1 g by mouth daily.   Yes Historical Provider, MD  metoprolol (LOPRESSOR) 50 MG tablet Take 1 tablet (50 mg total) by mouth 2 (two) times daily. 08/18/14  Yes Quentin Angst, MD  Multiple Vitamin (MULTIVITAMIN WITH MINERALS) TABS tablet Take 1 tablet by mouth daily. 03/16/13  Yes Quentin Angst, MD  pravastatin (PRAVACHOL) 20 MG tablet Take 1 tablet (20 mg total) by mouth daily. 08/18/14  Yes Quentin Angst, MD     Objective:   Filed Vitals:   08/18/14  0908  BP: 128/83  Pulse: 55  Temp: 97.5 F (36.4 C)  TempSrc: Oral  Resp: 18  Height: 5\' 1"  (1.549 m)  Weight: 119 lb (53.978 kg)  SpO2: 96%    Exam General appearance : Awake, alert, not in any distress. Speech Clear. Not toxic looking HEENT: Atraumatic and Normocephalic, pupils equally reactive to light and accomodation Neck: supple, no JVD. No cervical lymphadenopathy.  Chest:Good air entry bilaterally, no added sounds  CVS: S1 S2 regular, no murmurs.  Abdomen: Bowel sounds present, Non tender and not distended with no gaurding, rigidity or rebound. Extremities: B/L Lower Ext shows no edema, both legs are warm to touch Neurology: Awake alert, and oriented X 3, CN II-XII intact, Non focal Skin:No Rash  Data Review Lab Results  Component Value Date   HGBA1C 5.9 11/22/2013     Assessment & Plan   1. Dyslipidemia  To address this please limit saturated fat to no more than 7% of your calories, limit cholesterol to 200 mg/day, increase fiber and exercise as tolerated. If needed we may add another cholesterol lowering medication to your regimen.   - Lipid panel - pravastatin (PRAVACHOL) 20 MG tablet; Take 1 tablet (20 mg total) by mouth daily.  Dispense: 90 tablet; Refill: 3  2. Essential hypertension  - metoprolol (LOPRESSOR) 50 MG tablet; Take 1 tablet (50 mg total) by mouth 2 (two) times daily.  Dispense: 180  tablet; Refill: 3 - We have discussed target BP range and blood pressure goal - I have advised patient to check BP regularly and to call us back or report to clinic if the numbers are consistently higher than 140/90  - We discussed the importance of compliance with medical therapy and DASH diet recommended, consequences of uncontrolled hypertension discussed.  - continue current BP medications  3. Transaminasemia  - Hepatic Function Panel - POCT glycosylated hemoglobin (Hb A1C)  4. Generalized anxiety disorder  - citalopram (CELEXA) 40 MG tablet; Take 1  tablet (40 mg total) by mouth daily.  Dispense: 90 tablet; Refill: 3  5. Health maintenance examination  - MM Digital Screening; Future - DG Bone Density; Future  Patient have been counseled extensively about nutrition and exercise Return in about 6 months (around 02/17/2015), or if symptoms worsen or fail to improve, for Follow up HTN, Routine Follow Up.  The patient was given clear instructions to go to ER or return to medical center if symptoms don't improve, worsen or new problems develop. The patient verbalized understanding. The patient was told to call to get lab results if they haven't heard anything in the next week.   This note has been created with Education officer, environmental. Any transcriptional errors are unintentional.    Jeanann Lewandowsky, MD, MHA, CPE, FACP, FAAP [facility] [place], [place] [phone]   08/18/2014, 9:45 AM

## 2014-08-19 ENCOUNTER — Telehealth: Payer: Self-pay

## 2014-08-19 NOTE — Telephone Encounter (Signed)
Nurse called patient, patient verified date of birth. Patient aware of normal lab results. Patient aware of not being diabetic, normal cholesterol and normal liver function.

## 2014-08-19 NOTE — Telephone Encounter (Signed)
-----   Message from Quentin Angst, MD sent at 08/19/2014  9:19 AM EDT ----- Please inform patient had laboratory test results are within normal limits. She is not diabetic, cholesterol is normal and her liver function is back to normal.

## 2014-08-25 ENCOUNTER — Ambulatory Visit (HOSPITAL_COMMUNITY)
Admission: RE | Admit: 2014-08-25 | Discharge: 2014-08-25 | Disposition: A | Discharge status: Home / Self Care | Payer: No Typology Code available for payment source | Source: Ambulatory Visit | Attending: Internal Medicine | Admitting: Internal Medicine

## 2014-08-25 DIAGNOSIS — Z1231 Encounter for screening mammogram for malignant neoplasm of breast: Secondary | ICD-10-CM | POA: Insufficient documentation

## 2014-08-25 DIAGNOSIS — Z0000 Encounter for general adult medical examination without abnormal findings: Secondary | ICD-10-CM

## 2014-09-29 ENCOUNTER — Other Ambulatory Visit: Payer: Self-pay | Admitting: Internal Medicine

## 2014-09-29 DIAGNOSIS — E2839 Other primary ovarian failure: Secondary | ICD-10-CM

## 2014-10-02 ENCOUNTER — Emergency Department (INDEPENDENT_AMBULATORY_CARE_PROVIDER_SITE_OTHER)
Admission: EM | Admit: 2014-10-02 | Discharge: 2014-10-02 | Disposition: A | Discharge status: Home / Self Care | Payer: No Typology Code available for payment source | Source: Home / Self Care | Attending: Family Medicine | Admitting: Family Medicine

## 2014-10-02 ENCOUNTER — Encounter (HOSPITAL_COMMUNITY): Payer: Self-pay | Admitting: Emergency Medicine

## 2014-10-02 DIAGNOSIS — L247 Irritant contact dermatitis due to plants, except food: Secondary | ICD-10-CM

## 2014-10-02 MED ORDER — HYDROXYZINE HCL 25 MG PO TABS: 25.0000 mg | ORAL_TABLET | Freq: Four times a day (QID) | ORAL | Status: DC | PRN

## 2014-10-02 MED ORDER — PREDNISONE 10 MG PO TABS: 20.0000 mg | ORAL_TABLET | Freq: Every day | ORAL | Status: AC

## 2014-10-02 MED ORDER — PREDNISONE 20 MG PO TABS: 20.0000 mg | ORAL_TABLET | Freq: Every day | ORAL | Status: DC

## 2014-10-02 MED ORDER — HYDROXYZINE HCL 25 MG PO TABS: 25.0000 mg | ORAL_TABLET | Freq: Four times a day (QID) | ORAL | Status: DC

## 2014-10-02 NOTE — ED Notes (Signed)
Poison ivy for one week.  Red, blistery rash to ankles, arms, torso.  Has used otc and triamcinolone remedies with no relief

## 2014-10-02 NOTE — Discharge Instructions (Signed)
Poison Ivy  Poison ivy is a rash caused by touching the leaves of the poison ivy plant. The rash often shows up 48 hours later. You might just have bumps, redness, and itching. Sometimes, blisters appear and break open. Your eyes may get puffy (swollen). Poison ivy often heals in 2 to 3 weeks without treatment.  HOME CARE  · If you touch poison ivy:  ¨ Wash your skin with soap and water right away. Wash under your fingernails. Do not rub the skin very hard.  ¨ Wash any clothes you were wearing.  · Avoid poison ivy in the future. Poison ivy has 3 leaves on a stem.  · Use medicine to help with itching as told by your doctor. Do not drive when you take this medicine.  · Keep open sores dry, clean, and covered with a bandage and medicated cream, if needed.  · Ask your doctor about medicine for children.  GET HELP RIGHT AWAY IF:  · You have open sores.  · Redness spreads beyond the area of the rash.  · There is yellowish white fluid (pus) coming from the rash.  · Pain gets worse.  · You have a temperature by mouth above 102° F (38.9° C), not controlled by medicine.  MAKE SURE YOU:  · Understand these instructions.  · Will watch your condition.  · Will get help right away if you are not doing well or get worse.  Document Released: 03/23/2010 Document Revised: 05/13/2011 Document Reviewed: 03/23/2010  ExitCare® Patient Information ©2015 [employer]. This information is not intended to replace advice given to you by your health care provider. Make sure you discuss any questions you have with your health care provider.

## 2014-10-02 NOTE — ED Provider Notes (Signed)
CSN: [id number]     Arrival date & time 10/02/14  1508 History   First MD Initiated Contact with Patient 10/02/14 1600     Chief Complaint  Patient presents with  . Poison Ivy   Patient is a 69 y.o. female presenting with poison ivy. The history is provided by the patient. No language interpreter was used.  [name] This is a new problem. The current episode started more than 1 week ago (After working in her yard at home, few hours later she developed skin rash which is very itchy and spreading all over her body). The problem occurs constantly. The problem has been gradually worsening. Pertinent negatives include no chest pain, no abdominal pain, no headaches and no shortness of breath. Nothing aggravates the symptoms. Nothing relieves the symptoms. The treatment provided no relief.  She tried triamcinolone, allergra and OTC anti-itch, but non is helping with her symptoms.  Past Medical History  Diagnosis Date  . Hypertension   . High blood cholesterol   . GERD (gastroesophageal reflux disease)    Past Surgical History  Procedure Laterality Date  . Gallbladder surgery    . Cesarean section    . Esophagogastroduodenoscopy N/A 01/06/2013    Procedure: ESOPHAGOGASTRODUODENOSCOPY (EGD);  Surgeon: Barrie Folk, MD;  Location: [facility] ENDOSCOPY;  Service: Endoscopy;  Laterality: N/A;  . Colonoscopy N/A 01/06/2013    Procedure: COLONOSCOPY;  Surgeon: Barrie Folk, MD;  Location: [facility] ENDOSCOPY;  Service: Endoscopy;  Laterality: N/A;   Family History  Problem Relation Age of Onset  . Cancer Sister    History  Substance Use Topics  . Smoking status: Never Smoker   . Smokeless tobacco: Not on file  . Alcohol Use: No   OB History    No data available     Review of Systems  Constitutional: Negative for fever.  Respiratory: Negative.  Negative for shortness of breath.   Cardiovascular: Negative.  Negative for chest pain.  Gastrointestinal: Negative for abdominal pain.  Skin: Positive for  rash.  Neurological: Negative for headaches.  All other systems reviewed and are negative.  Filed Vitals:   10/02/14 1533  BP: 122/71  Pulse: 67  Temp: 98.6 F (37 C)  TempSrc: Oral  Resp: 18  SpO2: 97%    Allergies  Review of patient's allergies indicates no known allergies.  Home Medications   Prior to Admission medications   Medication Sig Start Date End Date Taking? Authorizing Provider  diphenhydrAMINE (BENADRYL) 25 mg capsule Take 25 mg by mouth every 6 (six) hours as needed.   Yes Historical Provider, MD  OVER THE COUNTER MEDICATION Anti itch spray-[facility]   Yes Historical Provider, MD  triamcinolone (KENALOG) 0.025 % cream Apply 1 application topically 2 (two) times daily.   Yes Historical Provider, MD  calcium-vitamin D (OSCAL WITH D) 500-200 MG-UNIT per tablet Take 1 tablet by mouth daily with breakfast. 10/21/13   Quentin Angst, MD  citalopram (CELEXA) 40 MG tablet Take 1 tablet (40 mg total) by mouth daily. 08/18/14   Quentin Angst, MD  fish oil-omega-3 fatty acids 1000 MG capsule Take 1 g by mouth daily.    Historical Provider, MD  metoprolol (LOPRESSOR) 50 MG tablet Take 1 tablet (50 mg total) by mouth 2 (two) times daily. 08/18/14   Quentin Angst, MD  Multiple Vitamin (MULTIVITAMIN WITH MINERALS) TABS tablet Take 1 tablet by mouth daily. 03/16/13   Quentin Angst, MD  pravastatin (PRAVACHOL) 20 MG tablet Take 1  tablet (20 mg total) by mouth daily. 08/18/14   Quentin Angst, MD   BP 122/71 mmHg  Pulse 67  Temp(Src) 98.6 F (37 C) (Oral)  Resp 18  SpO2 97% Physical Exam  Constitutional: She appears well-developed. No distress.  Cardiovascular: Normal rate, regular rhythm and normal heart sounds.   No murmur heard. Pulmonary/Chest: Effort normal and breath sounds normal. No respiratory distress. She has no wheezes.  Skin: Rash noted. Rash is maculopapular. There is erythema.     Nursing note and vitals reviewed.   ED Course  Procedures  (including critical care time) Labs Review Labs Reviewed - No data to display  Imaging Review No results found.   MDM  No diagnosis found.  Poison Ivy exposure/ Contact dermatitis.  Since this is wide spread she will benefit from oral steroid for 5-7 days. Continue topical triamcinolone. Hydroxyzine prn itching prescribed. Secondary bacterial infection not suspected hence A/B not warranted Return precaution discussed.    Doreene Eland, MD 10/02/14 [phone]

## 2014-10-10 ENCOUNTER — Encounter (HOSPITAL_COMMUNITY): Payer: Self-pay | Admitting: Emergency Medicine

## 2014-10-10 ENCOUNTER — Emergency Department (INDEPENDENT_AMBULATORY_CARE_PROVIDER_SITE_OTHER)
Admission: EM | Admit: 2014-10-10 | Discharge: 2014-10-10 | Disposition: A | Discharge status: Home / Self Care | Payer: Self-pay | Source: Home / Self Care | Attending: Family Medicine | Admitting: Family Medicine

## 2014-10-10 DIAGNOSIS — L237 Allergic contact dermatitis due to plants, except food: Secondary | ICD-10-CM

## 2014-10-10 MED ORDER — METHYLPREDNISOLONE ACETATE 80 MG/ML IJ SUSP
INTRAMUSCULAR | Status: AC
  Filled 2014-10-10: qty 1

## 2014-10-10 MED ORDER — TRIAMCINOLONE ACETONIDE 40 MG/ML IJ SUSP
40.0000 mg | Freq: Once | INTRAMUSCULAR | Status: AC
  Administered 2014-10-10: 40 mg via INTRAMUSCULAR

## 2014-10-10 MED ORDER — TRIAMCINOLONE ACETONIDE 40 MG/ML IJ SUSP
INTRAMUSCULAR | Status: AC
  Filled 2014-10-10: qty 1

## 2014-10-10 MED ORDER — METHYLPREDNISOLONE ACETATE 40 MG/ML IJ SUSP
80.0000 mg | Freq: Once | INTRAMUSCULAR | Status: AC
  Administered 2014-10-10: 80 mg via INTRAMUSCULAR

## 2014-10-10 MED ORDER — FLUTICASONE PROPIONATE 0.05 % EX CREA: TOPICAL_CREAM | Freq: Two times a day (BID) | CUTANEOUS | Status: DC

## 2014-10-10 NOTE — Discharge Instructions (Signed)
Use medicine as needed, return if any problems. °

## 2014-10-10 NOTE — ED Notes (Addendum)
SEEN 7/31 FOR THE SAME.  Some areas seem to be improving.  Patient is having new areas of rash breaking out.  Itchy rash.  No blisters.

## 2014-10-10 NOTE — ED Provider Notes (Signed)
CSN: [id number]     Arrival date & time 10/10/14  1302 History   First MD Initiated Contact with Patient 10/10/14 1334     Chief Complaint  Patient presents with  . Rash   (Consider location/radiation/quality/duration/timing/severity/associated sxs/prior Treatment) Patient is a 69 y.o. female presenting with rash. The history is provided by the patient.  Rash Location:  Full body Quality: blistering, dryness, itchiness and redness   Severity:  Mild Onset quality:  Gradual Duration:  10 days (seen 7/31 here at [facility] dx'd as poison ivy derm but rx not helping)   Past Medical History  Diagnosis Date  . Hypertension   . High blood cholesterol   . GERD (gastroesophageal reflux disease)    Past Surgical History  Procedure Laterality Date  . Gallbladder surgery    . Cesarean section    . Esophagogastroduodenoscopy N/A 01/06/2013    Procedure: ESOPHAGOGASTRODUODENOSCOPY (EGD);  Surgeon: Barrie Folk, MD;  Location: [facility] ENDOSCOPY;  Service: Endoscopy;  Laterality: N/A;  . Colonoscopy N/A 01/06/2013    Procedure: COLONOSCOPY;  Surgeon: Barrie Folk, MD;  Location: [facility] ENDOSCOPY;  Service: Endoscopy;  Laterality: N/A;   Family History  Problem Relation Age of Onset  . Cancer Sister    History  Substance Use Topics  . Smoking status: Never Smoker   . Smokeless tobacco: Not on file  . Alcohol Use: No   OB History    No data available     Review of Systems  Constitutional: Negative.   HENT: Negative.   Respiratory: Negative.   Gastrointestinal: Negative.   Skin: Positive for rash.    Allergies  Review of patient's allergies indicates no known allergies.  Home Medications   Prior to Admission medications   Medication Sig Start Date End Date Taking? Authorizing Provider  calcium-vitamin D (OSCAL WITH D) 500-200 MG-UNIT per tablet Take 1 tablet by mouth daily with breakfast. 10/21/13   Quentin Angst, MD  citalopram (CELEXA) 40 MG tablet Take 1 tablet (40 mg total) by  mouth daily. 08/18/14   Quentin Angst, MD  fish oil-omega-3 fatty acids 1000 MG capsule Take 1 g by mouth daily.    Historical Provider, MD  fluticasone (CUTIVATE) 0.05 % cream Apply topically 2 (two) times daily. 10/10/14   Linna Hoff, MD  hydrOXYzine (ATARAX/VISTARIL) 25 MG tablet Take 1 tablet (25 mg total) by mouth every 6 (six) hours. 10/02/14   Doreene Eland, MD  hydrOXYzine (ATARAX/VISTARIL) 25 MG tablet Take 1 tablet (25 mg total) by mouth every 6 (six) hours as needed. 10/02/14   Doreene Eland, MD  metoprolol (LOPRESSOR) 50 MG tablet Take 1 tablet (50 mg total) by mouth 2 (two) times daily. 08/18/14   Quentin Angst, MD  Multiple Vitamin (MULTIVITAMIN WITH MINERALS) TABS tablet Take 1 tablet by mouth daily. 03/16/13   Quentin Angst, MD  OVER THE COUNTER MEDICATION Anti [facility]ch spray-[facility]    Historical Provider, MD  pravastatin (PRAVACHOL) 20 MG tablet Take 1 tablet (20 mg total) by mouth daily. 08/18/14   Quentin Angst, MD  predniSONE (DELTASONE) 10 MG tablet Take 2 tablets (20 mg total) by mouth daily. 10/02/14 10/10/14  Doreene Eland, MD  triamcinolone (KENALOG) 0.025 % cream Apply 1 application topically 2 (two) times daily.    Historical Provider, MD   There were no vitals taken for this visit. Physical Exam  Constitutional: She is oriented to person, place, and time. She appears well-developed and well-nourished. No  distress.  Neurological: She is alert and oriented to person, place, and time.  Skin: Skin is warm and dry. Rash noted.  Diffuse irreg scattered dry pruritic patchy hyperpigmented rash areas, no infection.  Nursing note and vitals reviewed.   ED Course  Procedures (including critical care time) Labs Review Labs Reviewed - No data to display  Imaging Review No results found.   MDM   1. Contact dermatitis due to poison ivy        Linna Hoff, MD 10/10/14 [phone]

## 2014-10-13 ENCOUNTER — Ambulatory Visit
Admission: RE | Admit: 2014-10-13 | Discharge: 2014-10-13 | Disposition: A | Discharge status: Home / Self Care | Payer: No Typology Code available for payment source | Source: Ambulatory Visit | Attending: Internal Medicine | Admitting: Internal Medicine

## 2014-10-13 DIAGNOSIS — E2839 Other primary ovarian failure: Secondary | ICD-10-CM

## 2014-12-08 ENCOUNTER — Ambulatory Visit: Payer: No Typology Code available for payment source | Attending: Internal Medicine | Admitting: Internal Medicine

## 2014-12-08 ENCOUNTER — Encounter: Payer: Self-pay | Admitting: Internal Medicine

## 2014-12-08 VITALS — BP 110/71 | HR 70 | Temp 97.6°F | Resp 18 | Ht 61.0 in | Wt 122.0 lb

## 2014-12-08 DIAGNOSIS — E785 Hyperlipidemia, unspecified: Secondary | ICD-10-CM | POA: Insufficient documentation

## 2014-12-08 DIAGNOSIS — K219 Gastro-esophageal reflux disease without esophagitis: Secondary | ICD-10-CM | POA: Insufficient documentation

## 2014-12-08 DIAGNOSIS — Z79899 Other long term (current) drug therapy: Secondary | ICD-10-CM | POA: Insufficient documentation

## 2014-12-08 DIAGNOSIS — I10 Essential (primary) hypertension: Secondary | ICD-10-CM

## 2014-12-08 DIAGNOSIS — F411 Generalized anxiety disorder: Secondary | ICD-10-CM | POA: Insufficient documentation

## 2014-12-08 NOTE — Progress Notes (Signed)
Patient ID: Linda Hughes, female   DOB: [birth year], 69 y.o.   MRN: [id number]   Linda Hughes, is a 69 y.o. female  [id number]  [id number]  DOB - [birth year]  Chief Complaint  Patient presents with  . Follow-up        Subjective:   Linda Hughes is a 69 y.o. female with history of hypertension, dyslipidemia, generalized anxiety and GERD here today for a follow up visit. She has no new complaint today. Blood pressure is controlled. Patient is compliant with medications. Patient has No headache, No chest pain, No abdominal pain - No Nausea, No new weakness tingling or numbness, No Cough - SOB.  No problems updated.  ALLERGIES: No Known Allergies  PAST MEDICAL HISTORY: Past Medical History  Diagnosis Date  . Hypertension   . High blood cholesterol   . GERD (gastroesophageal reflux disease)     MEDICATIONS AT HOME: Prior to Admission medications   Medication Sig Start Date End Date Taking? Authorizing Provider  calcium-vitamin D (OSCAL WITH D) 500-200 MG-UNIT per tablet Take 1 tablet by mouth daily with breakfast. 10/21/13  Yes Quentin Angst, MD  citalopram (CELEXA) 40 MG tablet Take 1 tablet (40 mg total) by mouth daily. 08/18/14  Yes Quentin Angst, MD  fish oil-omega-3 fatty acids 1000 MG capsule Take 1 g by mouth daily.   Yes Historical Provider, MD  metoprolol (LOPRESSOR) 50 MG tablet Take 1 tablet (50 mg total) by mouth 2 (two) times daily. 08/18/14  Yes Quentin Angst, MD  Multiple Vitamin (MULTIVITAMIN WITH MINERALS) TABS tablet Take 1 tablet by mouth daily. 03/16/13  Yes Quentin Angst, MD  pravastatin (PRAVACHOL) 20 MG tablet Take 1 tablet (20 mg total) by mouth daily. 08/18/14  Yes Quentin Angst, MD  fluticasone (CUTIVATE) 0.05 % cream Apply topically 2 (two) times daily. Patient not taking: Reported on 12/08/2014 10/10/14   Linna Hoff, MD  hydrOXYzine (ATARAX/VISTARIL) 25 MG tablet Take 1 tablet (25 mg total) by mouth every 6 (six) hours. Patient  not taking: Reported on 12/08/2014 10/02/14   Doreene Eland, MD  hydrOXYzine (ATARAX/VISTARIL) 25 MG tablet Take 1 tablet (25 mg total) by mouth every 6 (six) hours as needed. Patient not taking: Reported on 12/08/2014 10/02/14   Doreene Eland, MD  OVER THE COUNTER MEDICATION Anti [facility]ch spray-[facility]    Historical Provider, MD  triamcinolone (KENALOG) 0.025 % cream Apply 1 application topically 2 (two) times daily.    Historical Provider, MD     Objective:   Filed Vitals:   12/08/14 0917  BP: 110/71  Pulse: 70  Temp: 97.6 F (36.4 C)  TempSrc: Oral  Resp: 18  Height:  (1.549 m)  Weight: 122 lb (55.339 kg)  SpO2: 99%    Exam General appearance : Awake, alert, not in any distress. Speech Clear. Not toxic looking HEENT: Atraumatic and Normocephalic, pupils equally reactive to light and accomodation Neck: supple, no JVD. No cervical lymphadenopathy.  Chest:Good air entry bilaterally, no added sounds  CVS: S1 S2 regular, no murmurs.  Abdomen: Bowel sounds present, Non tender and not distended with no gaurding, rigidity or rebound. Extremities: B/L Lower Ext shows no edema, both legs are warm to touch Neurology: Awake alert, and oriented X 3, CN II-XII intact, Non focal Skin:No Rash  Data Review Lab Results  Component Value Date   HGBA1C 5.60 08/18/2014   HGBA1C 5.9 11/22/2013     Assessment & Plan   1. Essential  hypertension  We have discussed target BP range and blood pressure goal. I have advised patient to check BP regularly and to call us back or report to clinic if the numbers are consistently higher than 140/90. We discussed the importance of compliance with medical therapy and DASH diet recommended, consequences of uncontrolled hypertension discussed.  - continue current BP medications  2. Dyslipidemia  To address this please limit saturated fat to no more than 7% of your calories, limit cholesterol to 200 mg/day, increase fiber and exercise as tolerated. If  needed we may add another cholesterol lowering medication to your regimen.   3. Generalized anxiety disorder  Continue current medications and therapy  Patient have been counseled extensively about nutrition and exercise  Return in about 3 months (around 03/10/2015) for Routine Follow Up, Follow up HTN.  The patient was given clear instructions to go to ER or return to medical center if symptoms don't improve, worsen or new problems develop. The patient verbalized understanding. The patient was told to call to get lab results if they haven't heard anything in the next week.   This note has been created with Education officer, environmental. Any transcriptional errors are unintentional.    Jeanann Lewandowsky, MD, MHA, FACP, FAAP, CPE [facility] [place], [place] [phone]   12/08/2014, 10:21 AM

## 2014-12-08 NOTE — Progress Notes (Signed)
Following up from Bone Density test.  Patient denies pain at this time.  Patient has not taken medications this morning.

## 2014-12-08 NOTE — Patient Instructions (Signed)
Hypertension Hypertension, commonly called high blood pressure, is when the force of blood pumping through your arteries is too strong. Your arteries are the blood vessels that carry blood from your heart throughout your body. A blood pressure reading consists of a higher number over a lower number, such as 110/72. The higher number (systolic) is the pressure inside your arteries when your heart pumps. The lower number (diastolic) is the pressure inside your arteries when your heart relaxes. Ideally you want your blood pressure below 120/80. Hypertension forces your heart to work harder to pump blood. Your arteries may become narrow or stiff. Having untreated or uncontrolled hypertension can cause heart attack, stroke, kidney disease, and other problems. RISK FACTORS Some risk factors for high blood pressure are controllable. Others are not.  Risk factors you cannot control include:   Race. You may be at higher risk if you are African American.  Age. Risk increases with age.  Gender. Men are at higher risk than women before age 45 years. After age 65, women are at higher risk than men. Risk factors you can control include:  Not getting enough exercise or physical activity.  Being overweight.  Getting too much fat, sugar, calories, or salt in your diet.  Drinking too much alcohol. SIGNS AND SYMPTOMS Hypertension does not usually cause signs or symptoms. Extremely high blood pressure (hypertensive crisis) may cause headache, anxiety, shortness of breath, and nosebleed. DIAGNOSIS To check if you have hypertension, your health care provider will measure your blood pressure while you are seated, with your arm held at the level of your heart. It should be measured at least twice using the same arm. Certain conditions can cause a difference in blood pressure between your right and left arms. A blood pressure reading that is higher than normal on one occasion does not mean that you need treatment. If  it is not clear whether you have high blood pressure, you may be asked to return on a different day to have your blood pressure checked again. Or, you may be asked to monitor your blood pressure at home for 1 or more weeks. TREATMENT Treating high blood pressure includes making lifestyle changes and possibly taking medicine. Living a healthy lifestyle can help lower high blood pressure. You may need to change some of your habits. Lifestyle changes may include:  Following the DASH diet. This diet is high in fruits, vegetables, and whole grains. It is low in salt, red meat, and added sugars.  Keep your sodium intake below 2,300 mg per day.  Getting at least 30-45 minutes of aerobic exercise at least 4 times per week.  Losing weight if necessary.  Not smoking.  Limiting alcoholic beverages.  Learning ways to reduce stress. Your health care provider may prescribe medicine if lifestyle changes are not enough to get your blood pressure under control, and if one of the following is true:  You are 18-59 years of age and your systolic blood pressure is above 140.  You are 60 years of age or older, and your systolic blood pressure is above 150.  Your diastolic blood pressure is above 90.  You have diabetes, and your systolic blood pressure is over 140 or your diastolic blood pressure is over 90.  You have kidney disease and your blood pressure is above 140/90.  You have heart disease and your blood pressure is above 140/90. Your personal target blood pressure may vary depending on your medical conditions, your age, and other factors. HOME CARE INSTRUCTIONS    Have your blood pressure rechecked as directed by your health care provider.   Take medicines only as directed by your health care provider. Follow the directions carefully. Blood pressure medicines must be taken as prescribed. The medicine does not work as well when you skip doses. Skipping doses also puts you at risk for  problems.  Do not smoke.   Monitor your blood pressure at home as directed by your health care provider. SEEK MEDICAL CARE IF:   You think you are having a reaction to medicines taken.  You have recurrent headaches or feel dizzy.  You have swelling in your ankles.  You have trouble with your vision. SEEK IMMEDIATE MEDICAL CARE IF:  You develop a severe headache or confusion.  You have unusual weakness, numbness, or feel faint.  You have severe chest or abdominal pain.  You vomit repeatedly.  You have trouble breathing. MAKE SURE YOU:   Understand these instructions.  Will watch your condition.  Will get help right away if you are not doing well or get worse.   This information is not intended to replace advice given to you by your health care provider. Make sure you discuss any questions you have with your health care provider.   Document Released: 02/18/2005 Document Revised: 07/05/2014 Document Reviewed: 12/11/2012 Elsevier Interactive Patient Education 2016 Elsevier Inc[employer] DASH Eating Plan DASH stands for "Dietary Approaches to Stop Hypertension." The DASH eating plan is a healthy eating plan that has been shown to reduce high blood pressure (hypertension). Additional health benefits may include reducing the risk of type 2 diabetes mellitus, heart disease, and stroke. The DASH eating plan may also help with weight loss. WHAT DO I NEED TO KNOW ABOUT THE DASH EATING PLAN? For the DASH eating plan, you will follow these general guidelines:  Choose foods with a percent daily value for sodium of less than 5% (as listed on the food label).  Use salt-free seasonings or herbs instead of table salt or sea salt.  Check with your health care provider or pharmacist before using salt substitutes.  Eat lower-sodium products, often labeled as "lower sodium" or "no salt added."  Eat fresh foods.  Eat more vegetables, fruits, and low-fat dairy products.  Choose whole grains.  Look for the word "whole" as the first word in the ingredient list.  Choose fish and skinless chicken or turkey more often than red meat. Limit fish, poultry, and meat to 6 oz (170 g) each day.  Limit sweets, desserts, sugars, and sugary drinks.  Choose heart-healthy fats.  Limit cheese to 1 oz (28 g) per day.  Eat more home-cooked food and less restaurant, buffet, and fast food.  Limit fried foods.  Cook foods using methods other than frying.  Limit canned vegetables. If you do use them, rinse them well to decrease the sodium.  When eating at a restaurant, ask that your food be prepared with less salt, or no salt if possible. WHAT FOODS CAN I EAT? Seek help from a dietitian for individual calorie needs. Grains Whole grain or whole wheat bread. Brown rice. Whole grain or whole wheat pasta. Quinoa, bulgur, and whole grain cereals. Low-sodium cereals. Corn or whole wheat flour tortillas. Whole grain cornbread. Whole grain crackers. Low-sodium crackers. Vegetables Fresh or frozen vegetables (raw, steamed, roasted, or grilled). Low-sodium or reduced-sodium tomato and vegetable juices. Low-sodium or reduced-sodium tomato sauce and paste. Low-sodium or reduced-sodium canned vegetables.  Fruits All fresh, canned (in natural juice), or frozen fruits. Meat and Other   Protein Products Ground beef (85% or leaner), grass-fed beef, or beef trimmed of fat. Skinless chicken or turkey. Ground chicken or turkey. Pork trimmed of fat. All fish and seafood. Eggs. Dried beans, peas, or lentils. Unsalted nuts and seeds. Unsalted canned beans. Dairy Low-fat dairy products, such as skim or 1% milk, 2% or reduced-fat cheeses, low-fat ricotta or cottage cheese, or plain low-fat yogurt. Low-sodium or reduced-sodium cheeses. Fats and Oils Tub margarines without trans fats. Light or reduced-fat mayonnaise and salad dressings (reduced sodium). Avocado. Safflower, olive, or canola oils. Natural peanut or almond  butter. Other Unsalted popcorn and pretzels. The items listed above may not be a complete list of recommended foods or beverages. Contact your dietitian for more options. WHAT FOODS ARE NOT RECOMMENDED? Grains White bread. White pasta. White rice. Refined cornbread. Bagels and croissants. Crackers that contain trans fat. Vegetables Creamed or fried vegetables. Vegetables in a cheese sauce. Regular canned vegetables. Regular canned tomato sauce and paste. Regular tomato and vegetable juices. Fruits Dried fruits. Canned fruit in light or heavy syrup. Fruit juice. Meat and Other Protein Products Fatty cuts of meat. Ribs, chicken wings, bacon, sausage, bologna, salami, chitterlings, fatback, hot dogs, bratwurst, and packaged luncheon meats. Salted nuts and seeds. Canned beans with salt. Dairy Whole or 2% milk, cream, half-and-half, and cream cheese. Whole-fat or sweetened yogurt. Full-fat cheeses or blue cheese. Nondairy creamers and whipped toppings. Processed cheese, cheese spreads, or cheese curds. Condiments Onion and garlic salt, seasoned salt, table salt, and sea salt. Canned and packaged gravies. Worcestershire sauce. Tartar sauce. Barbecue sauce. Teriyaki sauce. Soy sauce, including reduced sodium. Steak sauce. Fish sauce. Oyster sauce. Cocktail sauce. Horseradish. Ketchup and mustard. Meat flavorings and tenderizers. Bouillon cubes. Hot sauce. Tabasco sauce. Marinades. Taco seasonings. Relishes. Fats and Oils Butter, stick margarine, lard, shortening, ghee, and bacon fat. Coconut, palm kernel, or palm oils. Regular salad dressings. Other Pickles and olives. Salted popcorn and pretzels. The items listed above may not be a complete list of foods and beverages to avoid. Contact your dietitian for more information. WHERE CAN I FIND MORE INFORMATION? National Heart, Lung, and Blood Institute: www.nhlbi.nih.gov/health/health-topics/topics/dash/   This information is not intended to replace  advice given to you by your health care provider. Make sure you discuss any questions you have with your health care provider.   Document Released: 02/07/2011 Document Revised: 03/11/2014 Document Reviewed: 12/23/2012 Elsevier Interactive Patient Education 2016 Elsevier Inc.  

## 2015-02-02 ENCOUNTER — Ambulatory Visit: Payer: No Typology Code available for payment source | Attending: Internal Medicine

## 2015-03-08 MED FILL — METOPROLOL TARTRATE 50 MG T: 50 | 90 days supply | Qty: 180 | Fill #2

## 2015-03-08 MED FILL — CITALOPRAM HBR 40 MG TABLET: 40 | 90 days supply | Qty: 90 | Fill #1

## 2015-03-08 MED FILL — PRAVASTATIN NA 20 MG TAB: 20 | 90 days supply | Qty: 90 | Fill #2

## 2015-03-27 ENCOUNTER — Encounter: Payer: Self-pay | Admitting: Internal Medicine

## 2015-03-27 ENCOUNTER — Ambulatory Visit: Payer: No Typology Code available for payment source | Attending: Internal Medicine | Admitting: Internal Medicine

## 2015-03-27 VITALS — BP 127/82 | HR 61 | Temp 98.4°F | Resp 18 | Ht 61.0 in | Wt 120.0 lb

## 2015-03-27 DIAGNOSIS — R202 Paresthesia of skin: Secondary | ICD-10-CM

## 2015-03-27 DIAGNOSIS — R413 Other amnesia: Secondary | ICD-10-CM

## 2015-03-27 DIAGNOSIS — Z79899 Other long term (current) drug therapy: Secondary | ICD-10-CM | POA: Insufficient documentation

## 2015-03-27 DIAGNOSIS — E785 Hyperlipidemia, unspecified: Secondary | ICD-10-CM | POA: Insufficient documentation

## 2015-03-27 DIAGNOSIS — R208 Other disturbances of skin sensation: Secondary | ICD-10-CM | POA: Insufficient documentation

## 2015-03-27 DIAGNOSIS — R200 Anesthesia of skin: Secondary | ICD-10-CM

## 2015-03-27 DIAGNOSIS — R51 Headache: Secondary | ICD-10-CM | POA: Insufficient documentation

## 2015-03-27 DIAGNOSIS — K219 Gastro-esophageal reflux disease without esophagitis: Secondary | ICD-10-CM | POA: Insufficient documentation

## 2015-03-27 DIAGNOSIS — I10 Essential (primary) hypertension: Secondary | ICD-10-CM

## 2015-03-27 DIAGNOSIS — F411 Generalized anxiety disorder: Secondary | ICD-10-CM

## 2015-03-27 LAB — CBC WITH DIFFERENTIAL/PLATELET
Basophils Absolute: 0 10*3/uL (ref 0.0–0.1)
Basophils Relative: 0 % (ref 0–1)
EOS PCT: 2 % (ref 0–5)
Eosinophils Absolute: 0.1 10*3/uL (ref 0.0–0.7)
HEMATOCRIT: 42.9 % (ref 36.0–46.0)
Hemoglobin: 13.8 g/dL (ref 12.0–15.0)
LYMPHS PCT: 24 % (ref 12–46)
Lymphs Abs: 1.5 10*3/uL (ref 0.7–4.0)
MCH: 30.7 pg (ref 26.0–34.0)
MCHC: 32.2 g/dL (ref 30.0–36.0)
MCV: 95.5 fL (ref 78.0–100.0)
MONO ABS: 0.4 10*3/uL (ref 0.1–1.0)
MPV: 9.2 fL (ref 8.6–12.4)
Monocytes Relative: 7 % (ref 3–12)
Neutro Abs: 4.1 10*3/uL (ref 1.7–7.7)
Neutrophils Relative %: 67 % (ref 43–77)
Platelets: 231 10*3/uL (ref 150–400)
RBC: 4.49 MIL/uL (ref 3.87–5.11)
RDW: 12.4 % (ref 11.5–15.5)
WBC: 6.1 10*3/uL (ref 4.0–10.5)

## 2015-03-27 LAB — TSH: TSH: 2.149 u[IU]/mL (ref 0.350–4.500)

## 2015-03-27 LAB — VITAMIN B12: Vitamin B-12: 476 pg/mL (ref 211–911)

## 2015-03-27 NOTE — Progress Notes (Signed)
Patient is here for FU HTN  Patient denies pain at this time.  Patient has concerns about her Celexa.  Patient declined her flu shot today.

## 2015-03-27 NOTE — Progress Notes (Signed)
Patient ID: Linda Hughes, female   DOB: 10/30/1945, 69 y.o.   MRN: [id number]   Linda Hughes, is a 70 y.o. female  YQM:[id number]  [id number]  DOB - 1946-01-17  Chief Complaint  Patient presents with  . Follow-up    HTN        Subjective:   Linda Hughes is a 70 y.o. female history of hypertension, generalized anxiety and major depression controlled on Celexa here today for a follow up visit. Patient is complaining of left-sided upper limb numbness and tightness, headache and memory loss ongoing for the past year but slightly getting worse. Patient also complained that after walking for a while, she feels lightheaded and had to sit down or hold onto something to avoid falling down. She complains of occasional double vision but this also has been ongoing for more than 2 years. She however confesses to being stressed out lately and she worries a lot about her son who is in [place] with her husband. Her son is currently undergoing marital distress and also recently lost his job for which he is now depressed. She is not able to be with him and that worries her a lot. She is also worried about her 2 daughters who are here in the [place]. She is on Celexa and she claims this is helping her with her depression but she states she finds herself crying very easily. She is tearful during this encounter. Patient has No headache, No chest pain, No abdominal pain - No Nausea, No Cough - SOB.  Problem  Numbness and Tingling in Left Arm  Memory Loss    ALLERGIES: No Known Allergies  PAST MEDICAL HISTORY: Past Medical History  Diagnosis Date  . Hypertension   . High blood cholesterol   . GERD (gastroesophageal reflux disease)     MEDICATIONS AT HOME: Prior to Admission medications   Medication Sig Start Date End Date Taking? Authorizing Provider  calcium-vitamin D (OSCAL WITH D) 500-200 MG-UNIT per tablet Take 1 tablet by mouth daily with breakfast. 10/21/13  Yes Quentin Angst, MD    citalopram (CELEXA) 40 MG tablet Take 1 tablet (40 mg total) by mouth daily. 08/18/14  Yes Quentin Angst, MD  fish oil-omega-3 fatty acids 1000 MG capsule Take 1 g by mouth daily.   Yes Historical Provider, MD  metoprolol (LOPRESSOR) 50 MG tablet Take 1 tablet (50 mg total) by mouth 2 (two) times daily. 08/18/14  Yes Quentin Angst, MD  Multiple Vitamin (MULTIVITAMIN WITH MINERALS) TABS tablet Take 1 tablet by mouth daily. 03/16/13  Yes Quentin Angst, MD  pravastatin (PRAVACHOL) 20 MG tablet Take 1 tablet (20 mg total) by mouth daily. 08/18/14  Yes Quentin Angst, MD  triamcinolone (KENALOG) 0.025 % cream Apply 1 application topically 2 (two) times daily.   Yes Historical Provider, MD  fluticasone (CUTIVATE) 0.05 % cream Apply topically 2 (two) times daily. Patient not taking: Reported on 12/08/2014 10/10/14   Linna Hoff, MD  hydrOXYzine (ATARAX/VISTARIL) 25 MG tablet Take 1 tablet (25 mg total) by mouth every 6 (six) hours. Patient not taking: Reported on 12/08/2014 10/02/14   Doreene Eland, MD     Objective:   Filed Vitals:   03/27/15 0911  BP: 127/82  Pulse: 61  Temp: 98.4 F (36.9 C)  TempSrc: Oral  Resp: 18  Height:  (1.549 m)  Weight: 120 lb (54.432 kg)  SpO2: 100%    Exam General appearance : Awake, alert,  not in any distress. Speech Clear. Not toxic looking HEENT: Atraumatic and Normocephalic, pupils equally reactive to light and accomodation Neck: supple, no JVD. No cervical lymphadenopathy.  Chest:Good air entry bilaterally, no added sounds  CVS: S1 S2 regular, no murmurs.  Abdomen: Bowel sounds present, Non tender and not distended with no gaurding, rigidity or rebound. Extremities: B/L Lower Ext shows no edema, both legs are warm to touch Neurology: Awake alert, and oriented X 3, CN II-XII intact, Non focal Skin:No Rash  Data Review Lab Results  Component Value Date   HGBA1C 5.60 08/18/2014   HGBA1C 5.9 11/22/2013     Assessment &  Plan   1. Generalized anxiety disorder Continue Celexa and hydroxyzine Patient counseled Labs: TSH, CBCD, Vit B12 If results are normal including MRI, will add another anxiolytics Patient has been given referral to licensed clinical social worker  2. Essential hypertension  We have discussed target BP range and blood pressure goal. I have advised patient to check BP regularly and to call us back or report to clinic if the numbers are consistently higher than 140/90. We discussed the importance of compliance with medical therapy and DASH diet recommended, consequences of uncontrolled hypertension discussed.   - continue current BP medications  3. Numbness and tingling in left arm  - MR Brain Wo Contrast; Future  4. Memory loss  - MR Brain Wo Contrast; Future  Patient have been counseled extensively about nutrition and exercise  Return in about 3 months (around 06/25/2015) for Generalized Anxiety Disorder, Follow up HTN.  The patient was given clear instructions to go to ER or return to medical center if symptoms don't improve, worsen or new problems develop. The patient verbalized understanding. The patient was told to call to get lab results if they haven't heard anything in the next week.   This note has been created with Education officer, environmental. Any transcriptional errors are unintentional.    Jeanann Lewandowsky, MD, MHA, Maxwell Caul, CPE [facility] [place], [place] [phone]   03/27/2015, 9:43 AM

## 2015-03-27 NOTE — Patient Instructions (Signed)
DASH Eating Plan DASH stands for "Dietary Approaches to Stop Hypertension." The DASH eating plan is a healthy eating plan that has been shown to reduce high blood pressure (hypertension). Additional health benefits may include reducing the risk of type 2 diabetes mellitus, heart disease, and stroke. The DASH eating plan may also help with weight loss. WHAT DO I NEED TO KNOW ABOUT THE DASH EATING PLAN? For the DASH eating plan, you will follow these general guidelines:  Choose foods with a percent daily value for sodium of less than 5% (as listed on the food label).  Use salt-free seasonings or herbs instead of table salt or sea salt.  Check with your health care provider or pharmacist before using salt substitutes.  Eat lower-sodium products, often labeled as "lower sodium" or "no salt added."  Eat fresh foods.  Eat more vegetables, fruits, and low-fat dairy products.  Choose whole grains. Look for the word "whole" as the first word in the ingredient list.  Choose fish and skinless chicken or turkey more often than red meat. Limit fish, poultry, and meat to 6 oz (170 g) each day.  Limit sweets, desserts, sugars, and sugary drinks.  Choose heart-healthy fats.  Limit cheese to 1 oz (28 g) per day.  Eat more home-cooked food and less restaurant, buffet, and fast food.  Limit fried foods.  Cook foods using methods other than frying.  Limit canned vegetables. If you do use them, rinse them well to decrease the sodium.  When eating at a restaurant, ask that your food be prepared with less salt, or no salt if possible. WHAT FOODS CAN I EAT? Seek help from a dietitian for individual calorie needs. Grains Whole grain or whole wheat bread. Brown rice. Whole grain or whole wheat pasta. Quinoa, bulgur, and whole grain cereals. Low-sodium cereals. Corn or whole wheat flour tortillas. Whole grain cornbread. Whole grain crackers. Low-sodium crackers. Vegetables Fresh or frozen vegetables  (raw, steamed, roasted, or grilled). Low-sodium or reduced-sodium tomato and vegetable juices. Low-sodium or reduced-sodium tomato sauce and paste. Low-sodium or reduced-sodium canned vegetables.  Fruits All fresh, canned (in natural juice), or frozen fruits. Meat and Other Protein Products Ground beef (85% or leaner), grass-fed beef, or beef trimmed of fat. Skinless chicken or turkey. Ground chicken or turkey. Pork trimmed of fat. All fish and seafood. Eggs. Dried beans, peas, or lentils. Unsalted nuts and seeds. Unsalted canned beans. Dairy Low-fat dairy products, such as skim or 1% milk, 2% or reduced-fat cheeses, low-fat ricotta or cottage cheese, or plain low-fat yogurt. Low-sodium or reduced-sodium cheeses. Fats and Oils Tub margarines without trans fats. Light or reduced-fat mayonnaise and salad dressings (reduced sodium). Avocado. Safflower, olive, or canola oils. Natural peanut or almond butter. Other Unsalted popcorn and pretzels. The items listed above may not be a complete list of recommended foods or beverages. Contact your dietitian for more options. WHAT FOODS ARE NOT RECOMMENDED? Grains White bread. White pasta. White rice. Refined cornbread. Bagels and croissants. Crackers that contain trans fat. Vegetables Creamed or fried vegetables. Vegetables in a cheese sauce. Regular canned vegetables. Regular canned tomato sauce and paste. Regular tomato and vegetable juices. Fruits Dried fruits. Canned fruit in light or heavy syrup. Fruit juice. Meat and Other Protein Products Fatty cuts of meat. Ribs, chicken wings, bacon, sausage, bologna, salami, chitterlings, fatback, hot dogs, bratwurst, and packaged luncheon meats. Salted nuts and seeds. Canned beans with salt. Dairy Whole or 2% milk, cream, half-and-half, and cream cheese. Whole-fat or sweetened yogurt. Full-fat   cheeses or blue cheese. Nondairy creamers and whipped toppings. Processed cheese, cheese spreads, or cheese  curds. Condiments Onion and garlic salt, seasoned salt, table salt, and sea salt. Canned and packaged gravies. Worcestershire sauce. Tartar sauce. Barbecue sauce. Teriyaki sauce. Soy sauce, including reduced sodium. Steak sauce. Fish sauce. Oyster sauce. Cocktail sauce. Horseradish. Ketchup and mustard. Meat flavorings and tenderizers. Bouillon cubes. Hot sauce. Tabasco sauce. Marinades. Taco seasonings. Relishes. Fats and Oils Butter, stick margarine, lard, shortening, ghee, and bacon fat. Coconut, palm kernel, or palm oils. Regular salad dressings. Other Pickles and olives. Salted popcorn and pretzels. The items listed above may not be a complete list of foods and beverages to avoid. Contact your dietitian for more information. WHERE CAN I FIND MORE INFORMATION? National Heart, Lung, and Blood Institute: www.nhlbi.nih.gov/health/health-topics/topics/dash/   This information is not intended to replace advice given to you by your health care provider. Make sure you discuss any questions you have with your health care provider.   Document Released: 02/07/2011 Document Revised: 03/11/2014 Document Reviewed: 12/23/2012 Elsevier Interactive Patient Education 2016 Elsevier Inc. Hypertension Hypertension, commonly called high blood pressure, is when the force of blood pumping through your arteries is too strong. Your arteries are the blood vessels that carry blood from your heart throughout your body. A blood pressure reading consists of a higher number over a lower number, such as 110/72. The higher number (systolic) is the pressure inside your arteries when your heart pumps. The lower number (diastolic) is the pressure inside your arteries when your heart relaxes. Ideally you want your blood pressure below 120/80. Hypertension forces your heart to work harder to pump blood. Your arteries may become narrow or stiff. Having untreated or uncontrolled hypertension can cause heart attack, stroke, kidney  disease, and other problems. RISK FACTORS Some risk factors for high blood pressure are controllable. Others are not.  Risk factors you cannot control include:   Race. You may be at higher risk if you are African American.  Age. Risk increases with age.  Gender. Men are at higher risk than women before age 45 years. After age 65, women are at higher risk than men. Risk factors you can control include:  Not getting enough exercise or physical activity.  Being overweight.  Getting too much fat, sugar, calories, or salt in your diet.  Drinking too much alcohol. SIGNS AND SYMPTOMS Hypertension does not usually cause signs or symptoms. Extremely high blood pressure (hypertensive crisis) may cause headache, anxiety, shortness of breath, and nosebleed. DIAGNOSIS To check if you have hypertension, your health care provider will measure your blood pressure while you are seated, with your arm held at the level of your heart. It should be measured at least twice using the same arm. Certain conditions can cause a difference in blood pressure between your right and left arms. A blood pressure reading that is higher than normal on one occasion does not mean that you need treatment. If it is not clear whether you have high blood pressure, you may be asked to return on a different day to have your blood pressure checked again. Or, you may be asked to monitor your blood pressure at home for 1 or more weeks. TREATMENT Treating high blood pressure includes making lifestyle changes and possibly taking medicine. Living a healthy lifestyle can help lower high blood pressure. You may need to change some of your habits. Lifestyle changes may include:  Following the DASH diet. This diet is high in fruits, vegetables, and whole grains.   grains. It is low in salt, red meat, and added sugars. °· Keep your sodium intake below 2,300 mg per day. °· Getting at least 30-45 minutes of aerobic exercise at least 4 times per  week. °· Losing weight if necessary. °· Not smoking. °· Limiting alcoholic beverages. °· Learning ways to reduce stress. °Your health care provider may prescribe medicine if lifestyle changes are not enough to get your blood pressure under control, and if one of the following is true: °· You are 18-59 years of age and your systolic blood pressure is above 140. °· You are 60 years of age or older, and your systolic blood pressure is above 150. °· Your diastolic blood pressure is above 90. °· You have diabetes, and your systolic blood pressure is over 140 or your diastolic blood pressure is over 90. °· You have kidney disease and your blood pressure is above 140/90. °· You have heart disease and your blood pressure is above 140/90. °Your personal target blood pressure may vary depending on your medical conditions, your age, and other factors. °HOME CARE INSTRUCTIONS °· Have your blood pressure rechecked as directed by your health care provider.   °· Take medicines only as directed by your health care provider. Follow the directions carefully. Blood pressure medicines must be taken as prescribed. The medicine does not work as well when you skip doses. Skipping doses also puts you at risk for problems. °· Do not smoke.   °· Monitor your blood pressure at home as directed by your health care provider.  °SEEK MEDICAL CARE IF:  °· You think you are having a reaction to medicines taken. °· You have recurrent headaches or feel dizzy. °· You have swelling in your ankles. °· You have trouble with your vision. °SEEK IMMEDIATE MEDICAL CARE IF: °· You develop a severe headache or confusion. °· You have unusual weakness, numbness, or feel faint. °· You have severe chest or abdominal pain. °· You vomit repeatedly. °· You have trouble breathing. °MAKE SURE YOU:  °· Understand these instructions. °· Will watch your condition. °· Will get help right away if you are not doing well or get worse. °  °This information is not intended to  replace advice given to you by your health care provider. Make sure you discuss any questions you have with your health care provider. °  °Document Released: 02/18/2005 Document Revised: 07/05/2014 Document Reviewed: 12/11/2012 °Elsevier Interactive Patient Education ©2016 Elsevier Inc. °Generalized Anxiety Disorder °Generalized anxiety disorder (GAD) is a mental disorder. It interferes with life functions, including relationships, work, and school. °GAD is different from normal anxiety, which everyone experiences at some point in their lives in response to specific life events and activities. Normal anxiety actually helps us prepare for and get through these life events and activities. Normal anxiety goes away after the event or activity is over.  °GAD causes anxiety that is not necessarily related to specific events or activities. It also causes excess anxiety in proportion to specific events or activities. The anxiety associated with GAD is also difficult to control. GAD can vary from mild to severe. People with severe GAD can have intense waves of anxiety with physical symptoms (panic attacks).  °SYMPTOMS °The anxiety and worry associated with GAD are difficult to control. This anxiety and worry are related to many life events and activities and also occur more days than not for 6 months or longer. People with GAD also have three or more of the following symptoms (one or more in children): °·   Restlessness.   Fatigue.  Difficulty concentrating.   Irritability.  Muscle tension.  Difficulty sleeping or unsatisfying sleep. DIAGNOSIS GAD is diagnosed through an assessment by your health care provider. Your health care provider will ask you questions aboutyour mood,physical symptoms, and events in your life. Your health care provider may ask you about your medical history and use of alcohol or drugs, including prescription medicines. Your health care provider may also do a physical exam and blood  tests. Certain medical conditions and the use of certain substances can cause symptoms similar to those associated with GAD. Your health care provider may refer you to a mental health specialist for further evaluation. TREATMENT The following therapies are usually used to treat GAD:   Medication. Antidepressant medication usually is prescribed for long-term daily control. Antianxiety medicines may be added in severe cases, especially when panic attacks occur.   Talk therapy (psychotherapy). Certain types of talk therapy can be helpful in treating GAD by providing support, education, and guidance. A form of talk therapy called cognitive behavioral therapy can teach you healthy ways to think about and react to daily life events and activities.  Stress managementtechniques. These include yoga, meditation, and exercise and can be very helpful when they are practiced regularly. A mental health specialist can help determine which treatment is best for you. Some people see improvement with one therapy. However, other people require a combination of therapies.   This information is not intended to replace advice given to you by your health care provider. Make sure you discuss any questions you have with your health care provider.   Document Released: 06/15/2012 Document Revised: 03/11/2014 Document Reviewed: 06/15/2012 Elsevier Interactive Patient Education 2016 Elsevier Inc. Major Depressive Disorder Major depressive disorder is a mental illness. It also may be called clinical depression or unipolar depression. Major depressive disorder usually causes feelings of sadness, hopelessness, or helplessness. Some people with this disorder do not feel particularly sad but lose interest in doing things they used to enjoy (anhedonia). Major depressive disorder also can cause physical symptoms. It can interfere with work, school, relationships, and other normal everyday activities. The disorder varies in severity  but is longer lasting and more serious than the sadness we all feel from time to time in our lives. Major depressive disorder often is triggered by stressful life events or major life changes. Examples of these triggers include divorce, loss of your job or home, a move, and the death of a family member or close friend. Sometimes this disorder occurs for no obvious reason at all. People who have family members with major depressive disorder or bipolar disorder are at higher risk for developing this disorder, with or without life stressors. Major depressive disorder can occur at any age. It may occur just once in your life (single episode major depressive disorder). It may occur multiple times (recurrent major depressive disorder). SYMPTOMS People with major depressive disorder have either anhedonia or depressed mood on nearly a daily basis for at least 2 weeks or longer. Symptoms of depressed mood include:  Feelings of sadness (blue or down in the dumps) or emptiness.  Feelings of hopelessness or helplessness.  Tearfulness or episodes of crying (may be observed by others).  Irritability (children and adolescents). In addition to depressed mood or anhedonia or both, people with this disorder have at least four of the following symptoms:  Difficulty sleeping or sleeping too much.   Significant change (increase or decrease) in appetite or weight.   Lack of energy  or motivation.  Feelings of guilt and worthlessness.   Difficulty concentrating, remembering, or making decisions.  Unusually slow movement (psychomotor retardation) or restlessness (as observed by others).   Recurrent wishes for death, recurrent thoughts of self-harm (suicide), or a suicide attempt. People with major depressive disorder commonly have persistent negative thoughts about themselves, other people, and the world. People with severe major depressive disorder may experiencedistorted beliefs or perceptions about the  world (psychotic delusions). They also may see or hear things that are not real (psychotic hallucinations). DIAGNOSIS Major depressive disorder is diagnosed through an assessment by your health care provider. Your health care provider will ask aboutaspects of your daily life, such as mood,sleep, and appetite, to see if you have the diagnostic symptoms of major depressive disorder. Your health care provider may ask about your medical history and use of alcohol or drugs, including prescription medicines. Your health care provider also may do a physical exam and blood work. This is because certain medical conditions and the use of certain substances can cause major depressive disorder-like symptoms (secondary depression). Your health care provider also may refer you to a mental health specialist for further evaluation and treatment. TREATMENT It is important to recognize the symptoms of major depressive disorder and seek treatment. The following treatments can be prescribed for this disorder:   Medicine. Antidepressant medicines usually are prescribed. Antidepressant medicines are thought to correct chemical imbalances in the brain that are commonly associated with major depressive disorder. Other types of medicine may be added if the symptoms do not respond to antidepressant medicines alone or if psychotic delusions or hallucinations occur.  Talk therapy. Talk therapy can be helpful in treating major depressive disorder by providing support, education, and guidance. Certain types of talk therapy also can help with negative thinking (cognitive behavioral therapy) and with relationship issues that trigger this disorder (interpersonal therapy). A mental health specialist can help determine which treatment is best for you. Most people with major depressive disorder do well with a combination of medicine and talk therapy. Treatments involving electrical stimulation of the brain can be used in situations with  extremely severe symptoms or when medicine and talk therapy do not work over time. These treatments include electroconvulsive therapy, transcranial magnetic stimulation, and vagal nerve stimulation.   This information is not intended to replace advice given to you by your health care provider. Make sure you discuss any questions you have with your health care provider.   Document Released: 06/15/2012 Document Revised: 03/11/2014 Document Reviewed: 06/15/2012 Elsevier Interactive Patient Education [employer]

## 2015-03-28 LAB — VITAMIN D 25 HYDROXY (VIT D DEFICIENCY, FRACTURES): VIT D 25 HYDROXY: 48 ng/mL (ref 30–100)

## 2015-04-04 ENCOUNTER — Telehealth: Payer: Self-pay | Admitting: *Deleted

## 2015-04-04 NOTE — Telephone Encounter (Signed)
Patient verified DOB Patient informed of all her lab results being normal including her thyroid, vitamin B-12, vitamin D and blood count. Patient expressed her understanding and gratitude. Patient had no further questions.

## 2015-04-04 NOTE — Telephone Encounter (Signed)
-----   Message from Quentin Angst, MD sent at 03/30/2015  6:58 PM EST ----- Please inform patient that all her lab results are normal including thyroid function, vitamin B-12 and vitamin D level. Her blood count is normal.

## 2015-04-07 ENCOUNTER — Ambulatory Visit: Payer: No Typology Code available for payment source | Attending: Internal Medicine

## 2015-04-07 ENCOUNTER — Ambulatory Visit (HOSPITAL_COMMUNITY)
Admission: RE | Admit: 2015-04-07 | Discharge: 2015-04-07 | Disposition: A | Discharge status: Home / Self Care | Payer: No Typology Code available for payment source | Source: Ambulatory Visit | Attending: Internal Medicine | Admitting: Internal Medicine

## 2015-04-07 DIAGNOSIS — R269 Unspecified abnormalities of gait and mobility: Secondary | ICD-10-CM | POA: Insufficient documentation

## 2015-04-07 DIAGNOSIS — R200 Anesthesia of skin: Secondary | ICD-10-CM | POA: Insufficient documentation

## 2015-04-07 DIAGNOSIS — R413 Other amnesia: Secondary | ICD-10-CM | POA: Insufficient documentation

## 2015-04-07 DIAGNOSIS — R42 Dizziness and giddiness: Secondary | ICD-10-CM | POA: Insufficient documentation

## 2015-04-07 DIAGNOSIS — R202 Paresthesia of skin: Secondary | ICD-10-CM

## 2015-04-11 ENCOUNTER — Telehealth: Payer: Self-pay | Admitting: *Deleted

## 2015-04-11 NOTE — Telephone Encounter (Signed)
Medical Assistant unable to leave a voice message due to system not being set up.

## 2015-04-11 NOTE — Telephone Encounter (Signed)
-----   Message from Quentin Angst, MD sent at 04/07/2015 11:55 AM EST ----- Please inform patient that her brain MRI is negative for any acute findings

## 2015-04-19 NOTE — Telephone Encounter (Signed)
Patient verified DOB Patient informed of MRI showing no acute findings. Patient expressed her understanding and had no further questions.

## 2015-05-10 ENCOUNTER — Telehealth: Payer: Self-pay | Admitting: Internal Medicine

## 2015-05-10 NOTE — Telephone Encounter (Signed)
Pt will be out of town and would like medication amount to be expanded to cover that time period  Thank you.

## 2015-05-19 ENCOUNTER — Other Ambulatory Visit: Payer: Self-pay | Admitting: Internal Medicine

## 2015-05-19 DIAGNOSIS — I10 Essential (primary) hypertension: Secondary | ICD-10-CM

## 2015-05-19 DIAGNOSIS — F411 Generalized anxiety disorder: Secondary | ICD-10-CM

## 2015-05-19 DIAGNOSIS — E785 Hyperlipidemia, unspecified: Secondary | ICD-10-CM

## 2015-05-19 MED ORDER — PRAVASTATIN SODIUM 20 MG PO TABS: 20.0000 mg | ORAL_TABLET | Freq: Every day | ORAL | Status: DC

## 2015-05-19 MED ORDER — METOPROLOL TARTRATE 50 MG PO TABS: 50.0000 mg | ORAL_TABLET | Freq: Two times a day (BID) | ORAL | Status: DC

## 2015-05-19 MED ORDER — CALCIUM CARBONATE-VITAMIN D 500-200 MG-UNIT PO TABS
1.0000 | ORAL_TABLET | Freq: Every day | ORAL | Status: DC
Start: 2015-05-19 — End: 2015-12-28

## 2015-05-19 MED ORDER — CITALOPRAM HYDROBROMIDE 40 MG PO TABS: 40.0000 mg | ORAL_TABLET | Freq: Every day | ORAL | Status: DC

## 2015-06-01 MED FILL — OYSTER SHELL CAL 500 MG TAB: 500 | 180 days supply | Qty: 180 | Fill #0

## 2015-06-01 MED FILL — CITALOPRAM HBR 40 MG TABLET: 40 | 180 days supply | Qty: 180 | Fill #0

## 2015-06-01 MED FILL — METOPROLOL TARTRATE 50 MG T: 50 | 180 days supply | Qty: 360 | Fill #0

## 2015-06-01 MED FILL — PRAVASTATIN NA 20 MG TAB: 20 | 180 days supply | Qty: 180 | Fill #0

## 2015-12-25 ENCOUNTER — Ambulatory Visit: Payer: Self-pay | Attending: Internal Medicine

## 2015-12-28 ENCOUNTER — Encounter: Payer: Self-pay | Admitting: Internal Medicine

## 2015-12-28 ENCOUNTER — Ambulatory Visit: Payer: Self-pay | Attending: Internal Medicine | Admitting: Internal Medicine

## 2015-12-28 VITALS — BP 115/74 | HR 56 | Temp 98.0°F | Resp 16 | Ht 61.0 in | Wt 120.0 lb

## 2015-12-28 DIAGNOSIS — M549 Dorsalgia, unspecified: Secondary | ICD-10-CM | POA: Insufficient documentation

## 2015-12-28 DIAGNOSIS — I10 Essential (primary) hypertension: Secondary | ICD-10-CM | POA: Insufficient documentation

## 2015-12-28 DIAGNOSIS — F329 Major depressive disorder, single episode, unspecified: Secondary | ICD-10-CM | POA: Insufficient documentation

## 2015-12-28 DIAGNOSIS — K219 Gastro-esophageal reflux disease without esophagitis: Secondary | ICD-10-CM | POA: Insufficient documentation

## 2015-12-28 DIAGNOSIS — F411 Generalized anxiety disorder: Secondary | ICD-10-CM | POA: Insufficient documentation

## 2015-12-28 DIAGNOSIS — E785 Hyperlipidemia, unspecified: Secondary | ICD-10-CM | POA: Insufficient documentation

## 2015-12-28 DIAGNOSIS — R202 Paresthesia of skin: Secondary | ICD-10-CM

## 2015-12-28 DIAGNOSIS — Z79899 Other long term (current) drug therapy: Secondary | ICD-10-CM | POA: Insufficient documentation

## 2015-12-28 DIAGNOSIS — R200 Anesthesia of skin: Secondary | ICD-10-CM

## 2015-12-28 DIAGNOSIS — M1712 Unilateral primary osteoarthritis, left knee: Secondary | ICD-10-CM | POA: Insufficient documentation

## 2015-12-28 LAB — COMPLETE METABOLIC PANEL WITH GFR
ALT: 29 U/L (ref 6–29)
AST: 35 U/L (ref 10–35)
Albumin: 4.4 g/dL (ref 3.6–5.1)
Alkaline Phosphatase: 69 U/L (ref 33–130)
BUN: 17 mg/dL (ref 7–25)
CHLORIDE: 105 mmol/L (ref 98–110)
CO2: 28 mmol/L (ref 20–31)
Calcium: 9.4 mg/dL (ref 8.6–10.4)
Creat: 0.97 mg/dL — ABNORMAL HIGH (ref 0.60–0.93)
GFR, Est African American: 68 mL/min (ref >=60)
GFR, Est Non African American: 59 mL/min — ABNORMAL LOW (ref >=60)
Glucose, Bld: 105 mg/dL — ABNORMAL HIGH (ref 65–99)
POTASSIUM: 4 mmol/L (ref 3.5–5.3)
Sodium: 141 mmol/L (ref 135–146)
Total Bilirubin: 0.7 mg/dL (ref 0.2–1.2)
Total Protein: 7.7 g/dL (ref 6.1–8.1)

## 2015-12-28 LAB — LIPID PANEL
CHOL/HDL RATIO: 2.7 ratio (ref <=5.0)
Cholesterol: 210 mg/dL — ABNORMAL HIGH (ref 125–200)
HDL: 77 mg/dL (ref >=46)
LDL CALC: 115 mg/dL (ref <130)
TRIGLYCERIDES: 90 mg/dL (ref <150)
VLDL: 18 mg/dL (ref <30)

## 2015-12-28 MED ORDER — METOPROLOL TARTRATE 50 MG PO TABS: 50.0000 mg | ORAL_TABLET | Freq: Two times a day (BID) | ORAL | 3 refills | Status: DC

## 2015-12-28 MED ORDER — PRAVASTATIN SODIUM 20 MG PO TABS: 20.0000 mg | ORAL_TABLET | Freq: Every day | ORAL | 3 refills | Status: DC

## 2015-12-28 MED ORDER — ACETAMINOPHEN-CODEINE #3 300-30 MG PO TABS
1.0000 | ORAL_TABLET | ORAL | 0 refills | Status: DC | PRN
Start: 2015-12-28 — End: 2018-04-03

## 2015-12-28 MED ORDER — CITALOPRAM HYDROBROMIDE 40 MG PO TABS: 40.0000 mg | ORAL_TABLET | Freq: Every day | ORAL | 3 refills | Status: DC

## 2015-12-28 MED ORDER — CALCIUM CARBONATE-VITAMIN D 500-200 MG-UNIT PO TABS: 1.0000 | ORAL_TABLET | Freq: Every day | ORAL | 3 refills | Status: DC

## 2015-12-28 MED ORDER — DICLOFENAC SODIUM 1 % TD GEL: 4.0000 g | Freq: Four times a day (QID) | TRANSDERMAL | 1 refills | Status: DC

## 2015-12-28 MED FILL — PRAVASTATIN NA 20 MG TAB: 20 | 30 days supply | Qty: 30 | Fill #0

## 2015-12-28 MED FILL — ACETAMINOPHEN/COD #3 TABLET: 300-30 | 10 days supply | Qty: 60 | Fill #0

## 2015-12-28 MED FILL — VOLTAREN 1% GEL: 1 | 25 days supply | Qty: 100 | Fill #0

## 2015-12-28 MED FILL — METOPROLOL TARTRATE 50 MG T: 50 | 30 days supply | Qty: 60 | Fill #0

## 2015-12-28 MED FILL — ?CITALOPRAM HBR 40 MG TAB: 40 MG | 30 days supply | Qty: 30 | Fill #0

## 2015-12-28 NOTE — Patient Instructions (Signed)
Dyslipidemia  Dyslipidemia is an imbalance of the lipids in your blood. Lipids are waxy, fat-like proteins that your body needs in small amounts. Dyslipidemia often involves the lipids cholesterol or triglycerides. Common forms of dyslipidemia are:  · High levels of bad cholesterol (LDL cholesterol). LDL cholesterol is the type of cholesterol that causes heart disease.  · Low levels of good cholesterol (HDL cholesterol). HDL cholesterol is the type of cholesterol that helps protect against heart disease.  · High levels of triglycerides. Triglycerides are a fatty substance in the blood linked to a buildup of plaque on your arteries.  RISK FACTORS  · Increased age.  · Having a family history of high cholesterol.  · Certain medicines, including birth control pills, diuretics, beta-blockers, and some medicines for depression.  · Smoking.  · Eating a high-fat diet.  · Being overweight.  · Medical conditions such as diabetes, polycystic ovary syndrome, pregnancy, kidney disease, and hypothyroidism.  · Lack of regular exercise.  SIGNS AND SYMPTOMS  There are no signs or symptoms with dyslipidemia.  DIAGNOSIS  A simple blood test called a fasting blood test can be done to determine your level of:  · Total cholesterol. This is the combined number of LDL cholesterol and HDL cholesterol. A healthy number is lower than 200.  · LDL cholesterol. The goal number for LDL cholesterol is different for each person depending on risk factors. Ask your health care provider what your LDL cholesterol number should be.  · HDL cholesterol. A healthy level of HDL cholesterol is 60 or higher. A number lower than 40 for men or 50 for women is a danger sign.  · Triglycerides. A healthy triglyceride number is less than 150.  TREATMENT  Dyslipidemia is a treatable condition. Your health care provider will advise you on what type of treatment is best based on your age, your test results, and current guidelines. Treatment may include:  · Dietary  changes. A dietitian may help you create a diet that is based on your risk factors, conditions, and lifestyle.  · Regular exercise. This can help lower your LDL cholesterol, raise your HDL cholesterol, and help with weight management. Check with your health care provider before beginning an exercise program. Most people should participate in 30 minutes of brisk exercise 5 days a week.  · Quitting smoking.  · Medicines to lower LDL cholesterol and triglycerides.  · If you have high levels of triglycerides, your health care provider may:    Have you stop drinking alcohol.    Have you restrict your fat intake.    Have you eliminate refined sugars from your diet.    Treat you for other conditions, such as underactive thyroid gland (hypothyroidism) and high blood sugar (hyperglycemia).  Your health care provider will monitor your lipid levels with regular blood tests.  HOME CARE INSTRUCTIONS  · Eat a healthy diet. Follow any diet instructions if they were given to you by your health care provider.  · Maintain a healthy weight.  · Exercise regularly based on the recommendations of your health care provider.  · Do not use any tobacco products, including cigarettes, chewing tobacco, or electronic cigarettes.  · Take medicines only as directed by your health care provider.  · Keep all follow-up visits as directed by your health care provider.  SEEK MEDICAL CARE IF:  You are having possible side effects from your medicines.     This information is not intended to replace advice given to you by your   health care provider. Make sure you discuss any questions you have with your health care provider.     Document Released: 02/23/2013 Document Revised: 03/11/2014 Document Reviewed: 02/23/2013  Elsevier Interactive Patient Education ©2016 [employer]

## 2015-12-28 NOTE — Progress Notes (Signed)
Patient f/u anxiety; wants cholesterol/BS checked.   C/o low back pain x 1 month.  Bilateral knee pain x 2 months (stiff in am).  Has tried Panathol. Also experiences bilateral leg/arm tingling/numbness.   Referral to eye doctor for rt eye symptoms x 1year. Pollyann KennedyKim Becton, RN, BSN

## 2015-12-28 NOTE — Progress Notes (Signed)
Linda Hughes, is a 70 y.o. female  EAV:[id number]  [id number]:[id number]  DOB - Dec 30, 1945  Chief Complaint  Patient presents with  . Anxiety  . Back Pain      Subjective:   Linda Hughes is a 70 y.o. female with history of hypertension, generalized anxiety and major depression here today for a follow up visit and medication refill. She has no complaint today, BP is controlled, depression has improved, denies any suicidal thoughts or ideation. Major concern is ongoing arthritic pain in her left knee joint. No hx of fall. Patient has No headache, No chest pain, No abdominal pain - No Nausea, No new weakness tingling or numbness, No Cough - SOB.  No problems updated.  ALLERGIES: No Known Allergies  PAST MEDICAL HISTORY: Past Medical History:  Diagnosis Date  . GERD (gastroesophageal reflux disease)   . High blood cholesterol   . Hypertension     MEDICATIONS AT HOME: Prior to Admission medications   Medication Sig Start Date End Date Taking? Authorizing Provider  calcium-vitamin D (OSCAL WITH D) 500-200 MG-UNIT tablet Take 1 tablet by mouth daily with breakfast. 12/28/15  Yes Quentin Angstlugbemiga E Eshani Maestre, MD  citalopram (CELEXA) 40 MG tablet Take 1 tablet (40 mg total) by mouth daily. 12/28/15  Yes Quentin Angstlugbemiga E Jinelle Butchko, MD  fish oil-omega-3 fatty acids 1000 MG capsule Take 1 g by mouth daily.   Yes Historical Provider, MD  metoprolol (LOPRESSOR) 50 MG tablet Take 1 tablet (50 mg total) by mouth 2 (two) times daily. 12/28/15  Yes Quentin Angstlugbemiga E Myrtie Leuthold, MD  Multiple Vitamin (MULTIVITAMIN WITH MINERALS) TABS tablet Take 1 tablet by mouth daily. 03/16/13  Yes Quentin Angstlugbemiga E Ceazia Harb, MD  pravastatin (PRAVACHOL) 20 MG tablet Take 1 tablet (20 mg total) by mouth daily. 12/28/15  Yes Quentin Angstlugbemiga E Iliza Blankenbeckler, MD  acetaminophen-codeine (TYLENOL #3) 300-30 MG tablet Take 1 tablet by mouth every 4 (four) hours as needed. 12/28/15   Quentin Angstlugbemiga E Kardell Virgil, MD  diclofenac sodium (VOLTAREN) 1 % GEL Apply 4 g topically 4 (four)  times daily. 12/28/15   Quentin Angstlugbemiga E Aeris Hersman, MD  fluticasone (CUTIVATE) 0.05 % cream Apply topically 2 (two) times daily. Patient not taking: Reported on 12/08/2014 10/10/14   Linna HoffJames D Kindl, MD  hydrOXYzine (ATARAX/VISTARIL) 25 MG tablet Take 1 tablet (25 mg total) by mouth every 6 (six) hours. Patient not taking: Reported on 12/08/2014 10/02/14   Doreene ElandKehinde T Eniola, MD  triamcinolone (KENALOG) 0.025 % cream Apply 1 application topically 2 (two) times daily.    Historical Provider, MD    Objective:   Vitals:   12/28/15 0900  BP: 115/74  Pulse: (!) 56  Resp: 16  Temp: 98 F (36.7 C)  TempSrc: Oral  SpO2: 97%  Weight: 120 lb (54.4 kg)  Height: 5\' 1"  (1.549 m)   Exam General appearance : Awake, alert, not in any distress. Speech Clear. Not toxic looking HEENT: Atraumatic and Normocephalic, pupils equally reactive to light and accomodation Neck: Supple, no JVD. No cervical lymphadenopathy.  Chest: Good air entry bilaterally, no added sounds  CVS: S1 S2 regular, no murmurs.  Abdomen: Bowel sounds present, Non tender and not distended with no gaurding, rigidity or rebound. Extremities: B/L Lower Ext shows no edema, both legs are warm to touch Neurology: Awake alert, and oriented X 3, CN II-XII intact, Non focal Skin: No Rash  Data Review Lab Results  Component Value Date   HGBA1C 5.60 08/18/2014   HGBA1C 5.9 11/22/2013    Assessment & Plan  1. Essential hypertension  - metoprolol (LOPRESSOR) 50 MG tablet; Take 1 tablet (50 mg total) by mouth 2 (two) times daily.  Dispense: 180 tablet; Refill: 3 - COMPLETE METABOLIC PANEL WITH GFR - POCT glycosylated hemoglobin (Hb A1C)  We have discussed target BP range and blood pressure goal. I have advised patient to check BP regularly and to call us back or report to clinic if the numbers are consistently higher than 140/90. We discussed the importance of compliance with medical therapy and DASH diet recommended, consequences of uncontrolled  hypertension discussed.  - continue current BP medications  3. Dyslipidemia  - pravastatin (PRAVACHOL) 20 MG tablet; Take 1 tablet (20 mg total) by mouth daily.  Dispense: 90 tablet; Refill: 3 - calcium-vitamin D (OSCAL WITH D) 500-200 MG-UNIT tablet; Take 1 tablet by mouth daily with breakfast.  Dispense: 90 tablet; Refill: 3 - Lipid panel  To address this please limit saturated fat to no more than 7% of your calories, limit cholesterol to 200 mg/day, increase fiber and exercise as tolerated. If needed we may add another cholesterol lowering medication to your regimen.   4. Generalized anxiety disorder  - citalopram (CELEXA) 40 MG tablet; Take 1 tablet (40 mg total) by mouth daily.  Dispense: 90 tablet; Refill: 3  5. Primary osteoarthritis of left knee  - acetaminophen-codeine (TYLENOL #3) 300-30 MG tablet; Take 1 tablet by mouth every 4 (four) hours as needed.  Dispense: 60 tablet; Refill: 0 - diclofenac sodium (VOLTAREN) 1 % GEL; Apply 4 g topically 4 (four) times daily.  Dispense: 1 Tube; Refill: 1  Patient have been counseled extensively about nutrition and exercise. Other issues discussed during this visit include: low cholesterol diet, weight control and daily exercise, foot care, annual eye examinations at Ophthalmology, importance of adherence with medications and regular follow-up. We also discussed long term complications of uncontrolled diabetes and hypertension.   Return in about 6 months (around 06/27/2016) for Follow up HTN, Routine Follow Up.  The patient was given clear instructions to go to ER or return to medical center if symptoms don't improve, worsen or new problems develop. The patient verbalized understanding. The patient was told to call to get lab results if they haven't heard anything in the next week.   This note has been created with Education officer, environmental. Any transcriptional errors are unintentional.    Jeanann Lewandowsky, MD, MHA, Maxwell Caul, CPE [facility] and [facility] [place], [place] [phone]   12/28/2015, 9:47 AM

## 2015-12-29 ENCOUNTER — Telehealth: Payer: Self-pay | Admitting: *Deleted

## 2015-12-29 NOTE — Telephone Encounter (Signed)
Patient verified DOB Patient is aware of lab results being mostly normal including her cholesterol level. No changes at this time. Patient expressed her gratitude and had no further questions at this time.

## 2015-12-29 NOTE — Telephone Encounter (Signed)
-----   Message from Quentin Angstlugbemiga E Jegede, MD sent at 12/29/2015 11:00 AM EDT ----- Please inform patient that her lab results are mostly normal including her cholesterol level. No change in therapy.

## 2016-01-23 ENCOUNTER — Ambulatory Visit: Payer: Self-pay | Attending: Internal Medicine

## 2016-02-16 ENCOUNTER — Telehealth: Payer: Self-pay | Admitting: Internal Medicine

## 2016-02-16 MED FILL — CITALOPRAM HBR 40 MG TABLET: 40 | 90 days supply | Qty: 90 | Fill #1

## 2016-02-16 MED FILL — PRAVASTATIN NA 20 MG TAB: 20 | 90 days supply | Qty: 90 | Fill #1

## 2016-02-16 MED FILL — METOPROLOL TARTRATE 50 MG T: 50 | 90 days supply | Qty: 180 | Fill #1

## 2016-02-16 NOTE — Telephone Encounter (Signed)
Patient requesting another 3 months supply of current medications.

## 2016-02-16 NOTE — Telephone Encounter (Signed)
Patient is requesting medication refills for all medications... She needs 3 months refills.  Please follow up

## 2016-02-19 ENCOUNTER — Other Ambulatory Visit: Payer: Self-pay | Admitting: Internal Medicine

## 2016-02-19 DIAGNOSIS — F411 Generalized anxiety disorder: Secondary | ICD-10-CM

## 2016-02-19 DIAGNOSIS — E785 Hyperlipidemia, unspecified: Secondary | ICD-10-CM

## 2016-02-19 DIAGNOSIS — I10 Essential (primary) hypertension: Secondary | ICD-10-CM

## 2016-02-19 MED ORDER — PRAVASTATIN SODIUM 20 MG PO TABS: 20.0000 mg | ORAL_TABLET | Freq: Every day | ORAL | 3 refills | Status: DC

## 2016-02-19 MED ORDER — METOPROLOL TARTRATE 50 MG PO TABS: 50.0000 mg | ORAL_TABLET | Freq: Two times a day (BID) | ORAL | 3 refills | Status: DC

## 2016-02-19 MED ORDER — CITALOPRAM HYDROBROMIDE 40 MG PO TABS: 40.0000 mg | ORAL_TABLET | Freq: Every day | ORAL | 3 refills | Status: DC

## 2016-02-19 MED ORDER — CALCIUM CARBONATE-VITAMIN D 500-200 MG-UNIT PO TABS: 1.0000 | ORAL_TABLET | Freq: Every day | ORAL | 3 refills | Status: DC

## 2016-02-19 NOTE — Telephone Encounter (Signed)
3 months refills ordered

## 2016-02-28 ENCOUNTER — Ambulatory Visit: Payer: Self-pay | Attending: Internal Medicine

## 2016-04-02 ENCOUNTER — Other Ambulatory Visit: Payer: Self-pay | Admitting: Internal Medicine

## 2016-04-02 DIAGNOSIS — Z1231 Encounter for screening mammogram for malignant neoplasm of breast: Secondary | ICD-10-CM

## 2016-12-19 IMAGING — MR MR HEAD W/O CM
9 of 10 series · 36 of 48 positions shown · non-contrast
Comparison: 11/17/2009

CLINICAL DATA: Cognitive disturbance. Dizziness. Gait disturbance.
Initial encounter.

EXAM:
MRI HEAD WITHOUT CONTRAST
TECHNIQUE: Multiplanar, multiecho pulse sequences of the brain and surrounding
structures were obtained without intravenous contrast.

[Series 3: T1 · sagittal · 5.0mm · 0.47mm/px · 2 of 23 slices shown]
[im 1/23]
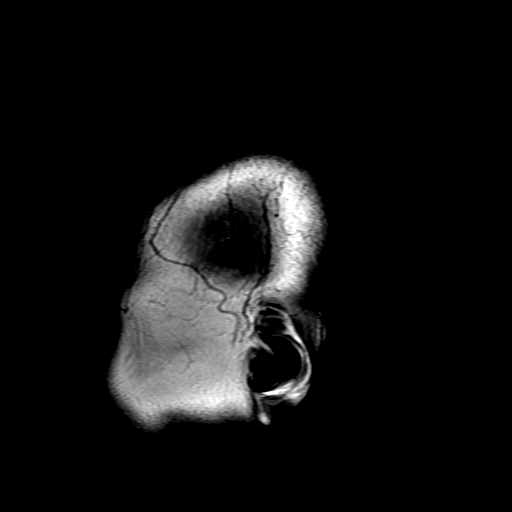
[im 23/23]
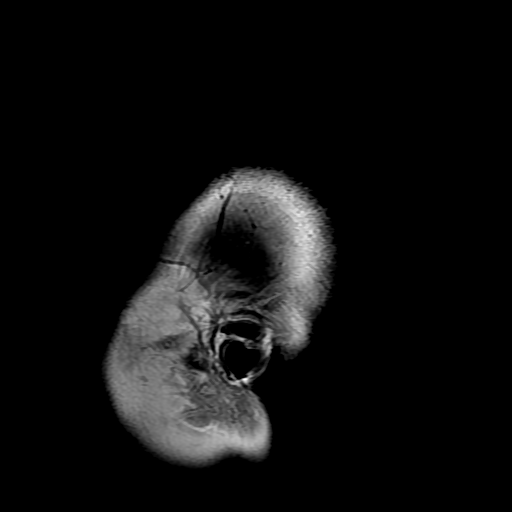

[Series 4: DWI · axial · 3.0mm · 1.09mm/px · z∈[-18,+119]mm · 9 of 94 slices shown (1 of 4)]
[im 1/94]
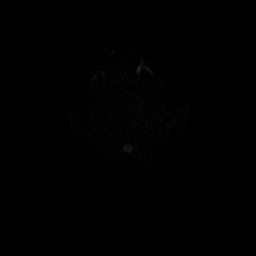
[im 11/94]
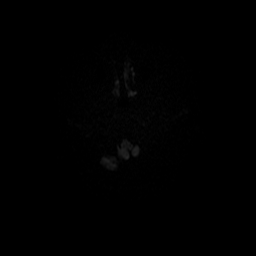
[im 21/94]
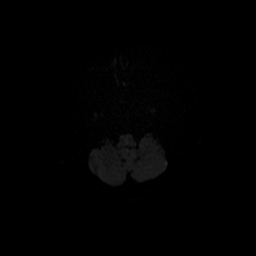
[im 32/94]
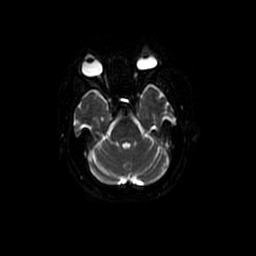
[im 42/94]
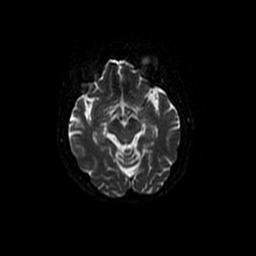
[im 52/94]
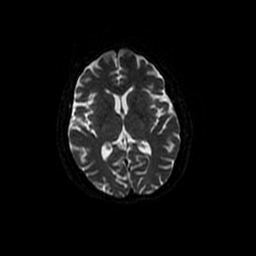
[im 63/94]
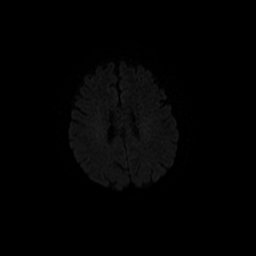
[im 83/94]
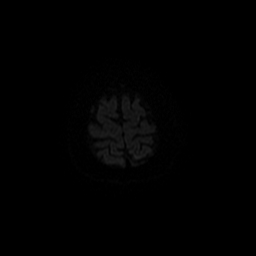
[im 94/94]
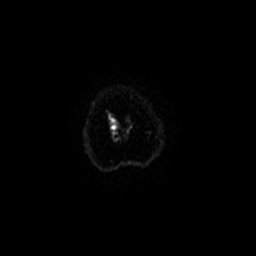

[Series 5: T2 · axial · 5.0mm · 0.43mm/px · z∈[-22,+115]mm · 3 of 24 slices shown]
[im 1/24]
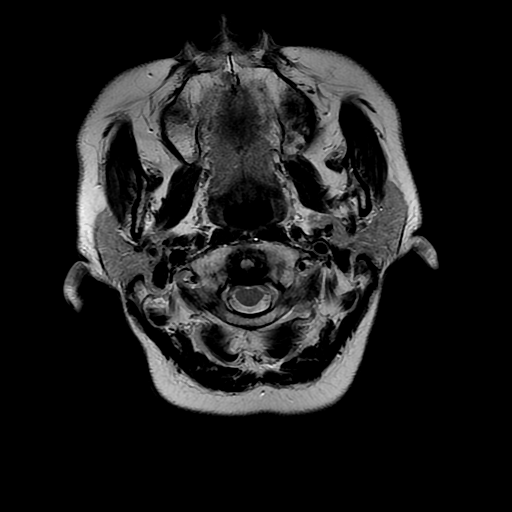
[im 12/24]
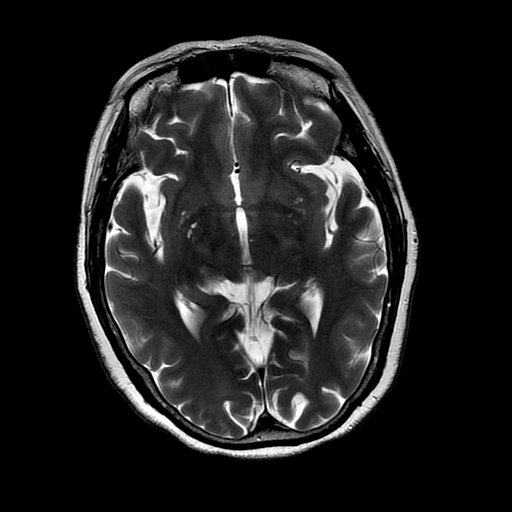
[im 24/24]
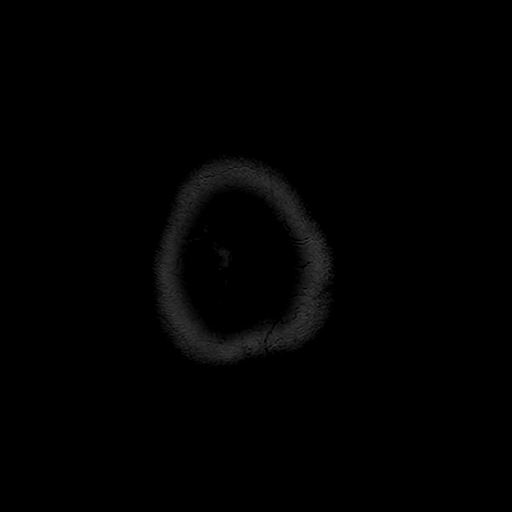

[Series 6: DWI · coronal · 5.0mm · 1.09mm/px · 7 of 66 slices shown (2 of 4)]
[im 1/66]
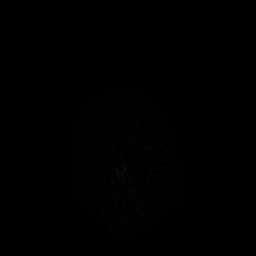
[im 11/66]
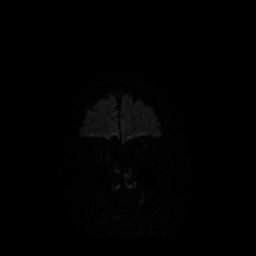
[im 22/66]
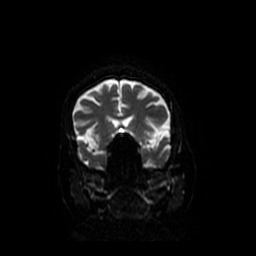
[im 33/66]
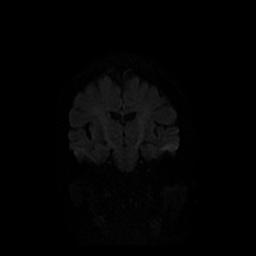
[im 44/66]
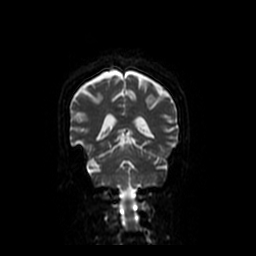
[im 55/66]
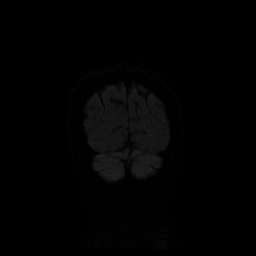
[im 66/66]
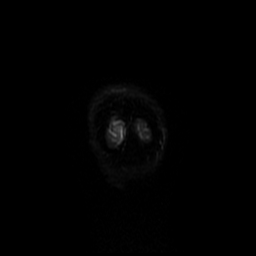

[Series 7: FLAIR · axial · 5.0mm · 0.43mm/px · z∈[-29,+109]mm · 2 of 21 slices shown]
[im 1/21]
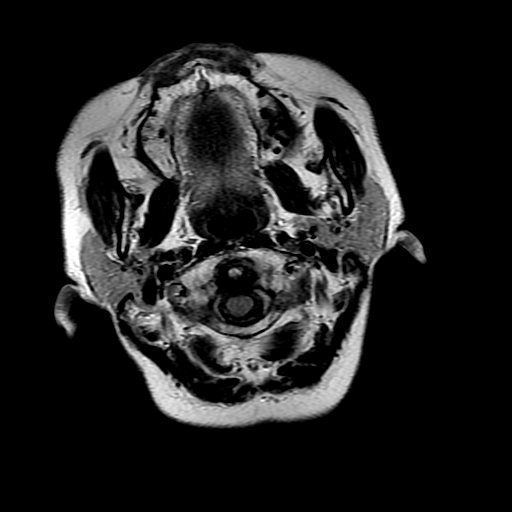
[im 21/21]
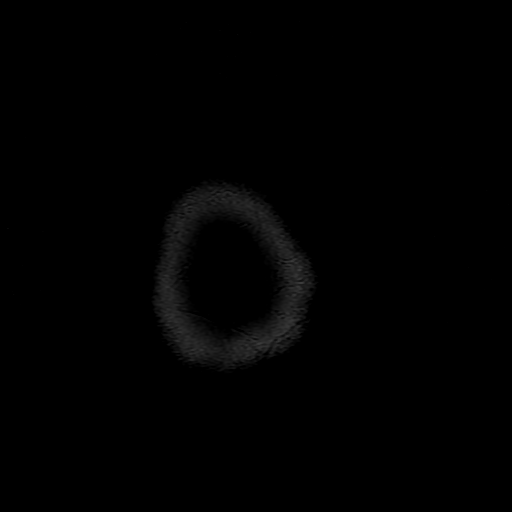

[Series 8: ax mpgr · axial · 5.0mm · 0.43mm/px · z∈[-29,+109]mm · 2 of 21 slices shown]
[im 1/21]
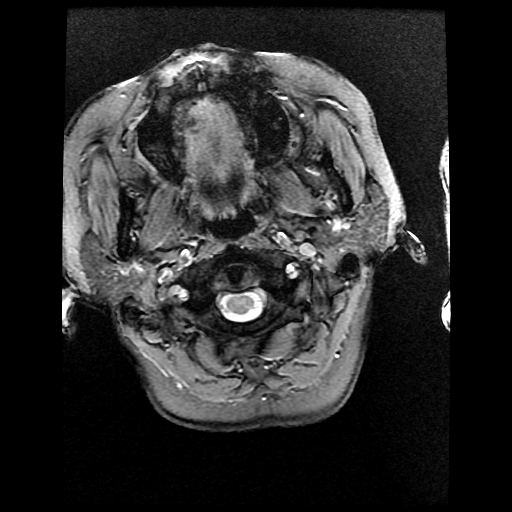
[im 21/21]
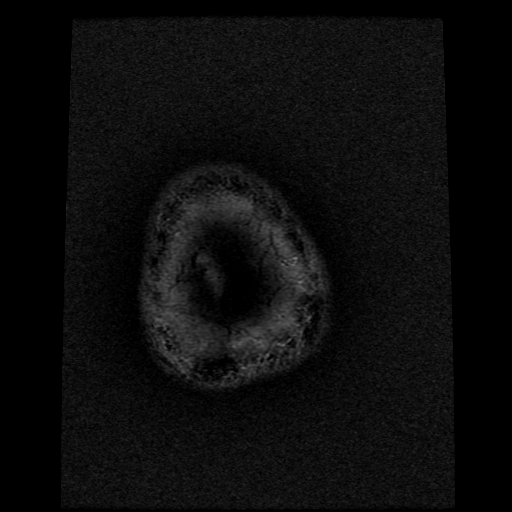

[Series 10: T2 post-contrast · coronal · 5.0mm · 0.39mm/px · 3 of 24 slices shown]
[im 1/24]
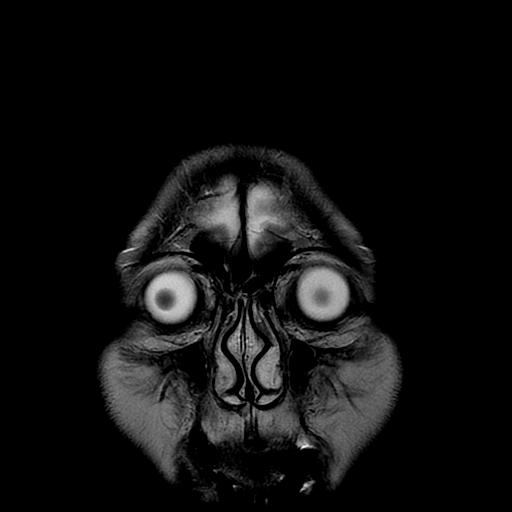
[im 12/24]
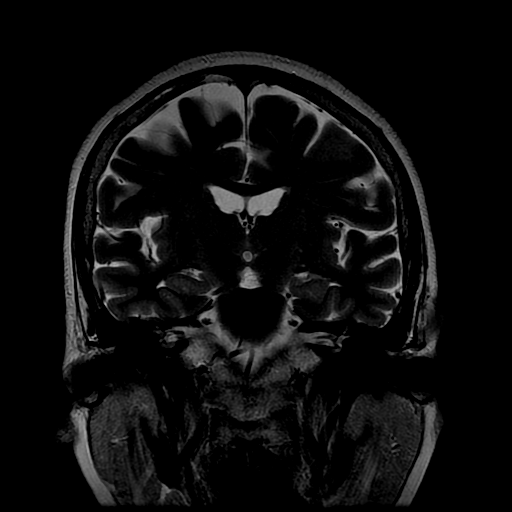
[im 24/24]
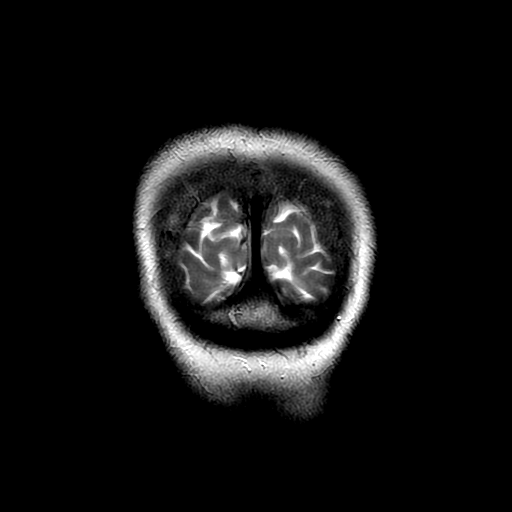

[Series 400: DWI · axial · 3.0mm · 1.09mm/px · z∈[-18,+119]mm · 5 of 47 slices shown (3 of 4)]
[im 1/47]
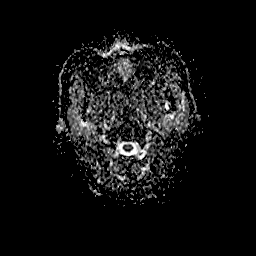
[im 12/47]
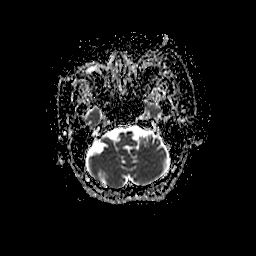
[im 24/47]
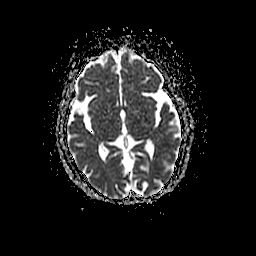
[im 35/47]
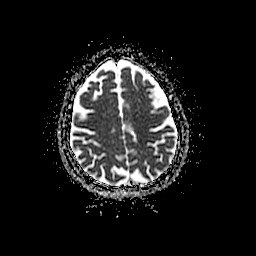
[im 47/47]
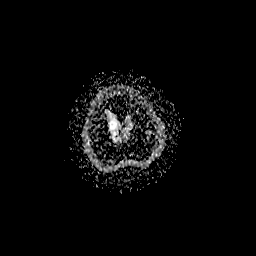

[Series 600: DWI · coronal · 5.0mm · 1.09mm/px · 3 of 33 slices shown (4 of 4)]
[im 1/33]
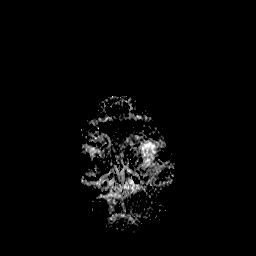
[im 17/33]
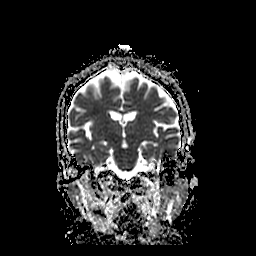
[im 33/33]
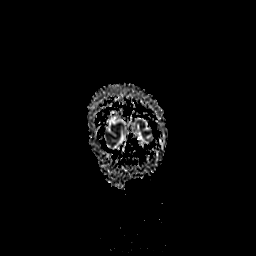

[36 of 48 positions shown; findings below may reference images not displayed]

FINDINGS: Diffusion imaging does not show any acute or subacute infarction. No
abnormality affects the brainstem or cerebellum. The cerebral
hemispheres are normal except for a few scattered foci of T2 and
FLAIR signal in the hemispheric white matter, minimal an often seen
in persons of this age. No cortical or large vessel territory
infarction. No mass lesion, hemorrhage, hydrocephalus or extra-axial
collection. No pituitary mass. No inflammatory sinus disease. No
skull or skullbase lesion. Major vessels at the base of the brain
show flow.
IMPRESSION: No acute or reversible finding. Normal except for a few small foci
of T2 and FLAIR signal within the cerebral hemispheric white matter
likely to represent an early and minimal manifestation of small
vessel disease, frequently seen and persons of this age and possibly
subclinical.

## 2018-04-03 ENCOUNTER — Encounter: Payer: Self-pay | Admitting: Nurse Practitioner

## 2018-04-03 ENCOUNTER — Ambulatory Visit: Payer: Self-pay | Attending: Nurse Practitioner | Admitting: Nurse Practitioner

## 2018-04-03 VITALS — BP 136/80 | HR 68 | Temp 97.7°F | Wt 126.8 lb

## 2018-04-03 DIAGNOSIS — F411 Generalized anxiety disorder: Secondary | ICD-10-CM

## 2018-04-03 DIAGNOSIS — I2510 Atherosclerotic heart disease of native coronary artery without angina pectoris: Secondary | ICD-10-CM

## 2018-04-03 DIAGNOSIS — I2583 Coronary atherosclerosis due to lipid rich plaque: Secondary | ICD-10-CM

## 2018-04-03 DIAGNOSIS — K219 Gastro-esophageal reflux disease without esophagitis: Secondary | ICD-10-CM

## 2018-04-03 DIAGNOSIS — E782 Mixed hyperlipidemia: Secondary | ICD-10-CM

## 2018-04-03 DIAGNOSIS — I10 Essential (primary) hypertension: Secondary | ICD-10-CM

## 2018-04-03 MED ORDER — ASPIRIN EC 81 MG PO TBEC: 81.0000 mg | DELAYED_RELEASE_TABLET | Freq: Every day | ORAL | 0 refills | Status: AC

## 2018-04-03 MED ORDER — CITALOPRAM HYDROBROMIDE 40 MG PO TABS: 40.0000 mg | ORAL_TABLET | Freq: Every day | ORAL | 3 refills | Status: DC

## 2018-04-03 MED ORDER — ROSUVASTATIN CALCIUM 10 MG PO TABS: 10.0000 mg | ORAL_TABLET | Freq: Every day | ORAL | 3 refills | Status: DC

## 2018-04-03 MED ORDER — PANTOPRAZOLE SODIUM 40 MG PO TBEC: 40.0000 mg | DELAYED_RELEASE_TABLET | Freq: Every day | ORAL | 3 refills | Status: DC

## 2018-04-03 MED ORDER — METOPROLOL TARTRATE 50 MG PO TABS: 50.0000 mg | ORAL_TABLET | Freq: Two times a day (BID) | ORAL | 3 refills | Status: DC

## 2018-04-03 MED FILL — CITALOPRAM HBR 40 MG TABLET: 40 | 30 days supply | Qty: 30 | Fill #0

## 2018-04-03 MED FILL — METOPROLOL TARTRATE 50 MG T: 50 | 30 days supply | Qty: 60 | Fill #0

## 2018-04-03 MED FILL — ROSUVASTATIN CALCIUM 10 MG: 10 | 30 days supply | Qty: 30 | Fill #0

## 2018-04-03 MED FILL — PANTOPRAZOLE SOD DR 40 MG T: 40 | 30 days supply | Qty: 30 | Fill #0

## 2018-04-03 NOTE — Progress Notes (Signed)
Assessment & Plan:  Linda Hughes was seen today for establish care.  Diagnoses and all orders for this visit:  Essential hypertension -     metoprolol tartrate (LOPRESSOR) 50 MG tablet; Take 1 tablet (50 mg total) by mouth 2 (two) times daily. Continue all antihypertensives as prescribed.  Remember to bring in your blood pressure log with you for your follow up appointment.  DASH/Mediterranean Diets are healthier choices for HTN.    Generalized anxiety disorder -     citalopram (CELEXA) 40 MG tablet; Take 1 tablet (40 mg total) by mouth daily. Well controlled. Denies any thoughts of self harm  Coronary artery disease due to lipid rich plaque -     aspirin EC 81 MG tablet; Take 1 tablet (81 mg total) by mouth daily. -     Ambulatory referral to Cardiology -     VITAMIN D 25 Hydroxy (Vit-D Deficiency, Fractures)  Mixed hyperlipidemia -     rosuvastatin (CRESTOR) 10 MG tablet; Take 1 tablet (10 mg total) by mouth daily. INSTRUCTIONS: Work on a low fat, heart healthy diet and participate in regular aerobic exercise program by working out at least 150 minutes per week; 5 days a week-30 minutes per day. Avoid red meat, fried foods. junk foods, sodas, sugary drinks, unhealthy snacking, alcohol and smoking.  Drink at least 48oz of water per day and monitor your carbohydrate intake daily.    Gastroesophageal reflux disease, esophagitis presence not specified -     pantoprazole (PROTONIX) 40 MG tablet; Take 1 tablet (40 mg total) by mouth daily. INSTRUCTIONS: Avoid GERD Triggers: acidic, spicy or fried foods, caffeine, coffee, sodas,  alcohol and chocolate.   Patient has been counseled on age-appropriate routine health concerns for screening and prevention. These are reviewed and up-to-date. Referrals have been placed accordingly. Immunizations are up-to-date or declined.    Subjective:   Chief Complaint  Patient presents with  . Establish Care   HPI Linda Hughes 73 y.o. female presents to  office today to establish care. She has not been seen in this office since 2017. [place] ago after being in [place] taking care of her husband for 5 months. She was evaluated by a cardiologist in [place] and had a Coronary CT performed with calcium score 144 and "calcified plaques in the proximal and middle LAD with mild and insignificant narrowing of luminal diameters (25-49%)". She was switched to crestor due to stenosis.   Essential Hypertension Chronic and stable. She takes metoprolol tartrate 50mg  BID. She does endorse intermittent mild chest pain and denies shortness of breath, palpitations, lightheadedness, dizziness, headaches or BLE edema.  BP Readings from Last 3 Encounters:  04/03/18 136/80  12/28/15 115/74  03/27/15 127/82    Hyperlipidemia Patient presents for follow up to hyperlipidemia.  She is medication compliant taking crestor. She is diet compliant and denies statin intolerance including myalgias.  Lab Results  Component Value Date   CHOL 210 (H) 12/28/2015   Lab Results  Component Value Date   HDL 77 12/28/2015   Lab Results  Component Value Date   LDLCALC 115 12/28/2015   Lab Results  Component Value Date   TRIG 90 12/28/2015   Lab Results  Component Value Date   CHOLHDL 2.7 12/28/2015    Review of Systems  Constitutional: Negative for fever, malaise/fatigue and weight loss.  HENT: Negative.  Negative for nosebleeds.   Eyes: Negative.  Negative for blurred vision, double vision and  photophobia.  Respiratory: Negative.  Negative for cough and shortness of breath.   Cardiovascular: Positive for chest pain (intermittent. Currently denies). Negative for palpitations and leg swelling.  Gastrointestinal: Positive for heartburn. Negative for nausea and vomiting.  Musculoskeletal: Negative.  Negative for myalgias.  Neurological: Negative.  Negative for dizziness, focal weakness, seizures and headaches.  Psychiatric/Behavioral:  Negative for suicidal ideas. The patient is nervous/anxious.     Past Medical History:  Diagnosis Date  . GERD (gastroesophageal reflux disease)   . High blood cholesterol   . Hypertension     Past Surgical History:  Procedure Laterality Date  . CESAREAN SECTION    . COLONOSCOPY N/A 01/06/2013   Procedure: COLONOSCOPY;  Surgeon: Barrie Folk, MD;  [facility] ENDOSCOPY;  Service: Endoscopy;  Laterality: N/A;  . ESOPHAGOGASTRODUODENOSCOPY N/A 01/06/2013   Procedure: ESOPHAGOGASTRODUODENOSCOPY (EGD);  Surgeon: Barrie Folk, MD;  Location: [facility] ENDOSCOPY;  Service: Endoscopy;  Laterality: N/A;  . GALLBLADDER SURGERY      Family History  Problem Relation Age of Onset  . Cancer Sister     Social History Reviewed with no changes to be made today.   Outpatient Medications Prior to Visit  Medication Sig Dispense Refill  . hydrOXYzine (ATARAX/VISTARIL) 25 MG tablet Take 1 tablet (25 mg total) by mouth every 6 (six) hours. 10 tablet 0  . Multiple Vitamin (MULTIVITAMIN WITH MINERALS) TABS tablet Take 1 tablet by mouth daily. 90 tablet 3  . triamcinolone (KENALOG) 0.025 % cream Apply 1 application topically 2 (two) times daily.    Marland Kitchen acetaminophen-codeine (TYLENOL #3) 300-30 MG tablet Take 1 tablet by mouth every 4 (four) hours as needed. 60 tablet 0  . calcium-vitamin D (OSCAL WITH D) 500-200 MG-UNIT tablet Take 1 tablet by mouth daily with breakfast. 90 tablet 3  . citalopram (CELEXA) 40 MG tablet Take 1 tablet (40 mg total) by mouth daily. 90 tablet 3  . diclofenac sodium (VOLTAREN) 1 % GEL Apply 4 g topically 4 (four) times daily. 1 Tube 1  . fish oil-omega-3 fatty acids 1000 MG capsule Take 1 g by mouth daily.    . fluticasone (CUTIVATE) 0.05 % cream Apply topically 2 (two) times daily. 60 g 1  . metoprolol (LOPRESSOR) 50 MG tablet Take 1 tablet (50 mg total) by mouth 2 (two) times daily. 180 tablet 3  . pravastatin (PRAVACHOL) 20 MG tablet Take 1 tablet (20 mg total) by mouth daily. 90  tablet 3   No facility-administered medications prior to visit.     No Known Allergies     Objective:    BP 136/80   Pulse 68   Temp 97.7 F (36.5 C) (Oral)   Wt 126 lb 12.8 oz (57.5 kg)   SpO2 96%   BMI 23.96 kg/m  Wt Readings from Last 3 Encounters:  04/03/18 126 lb 12.8 oz (57.5 kg)  12/28/15 120 lb (54.4 kg)  03/27/15 120 lb (54.4 kg)    Physical Exam Vitals signs and nursing note reviewed.  Constitutional:      Appearance: She is well-developed.  HENT:     Head: Normocephalic and atraumatic.  Neck:     Musculoskeletal: Normal range of motion.  Cardiovascular:     Rate and Rhythm: Normal rate and regular rhythm.     Heart sounds: Normal heart sounds. No murmur. No friction rub. No gallop.   Pulmonary:     Effort: Pulmonary effort is normal. No tachypnea or respiratory distress.     Breath sounds:  Normal breath sounds. No decreased breath sounds, wheezing, rhonchi or rales.  Chest:     Chest wall: No tenderness.  Abdominal:     General: Bowel sounds are normal.     Palpations: Abdomen is soft.  Musculoskeletal: Normal range of motion.  Skin:    General: Skin is warm and dry.  Neurological:     Mental Status: She is alert and oriented to person, place, and time.     Coordination: Coordination normal.  Psychiatric:        Behavior: Behavior normal. Behavior is cooperative.        Thought Content: Thought content normal.        Judgment: Judgment normal.          Patient has been counseled extensively about nutrition and exercise as well as the importance of adherence with medications and regular follow-up. The patient was given clear instructions to go to ER or return to medical center if symptoms don't improve, worsen or new problems develop. The patient verbalized understanding.   Follow-up: Return in about 6 weeks (around 05/15/2018) for epigastric pain, Needs appointment with financial representative.Claiborne Rigg, FNP-BC [facility] and [facility] [place], [place] [phone]   04/03/2018, 10:49 PM

## 2018-04-04 LAB — VITAMIN D 25 HYDROXY (VIT D DEFICIENCY, FRACTURES): Vit D, 25-Hydroxy: 56.7 ng/mL (ref 30.0–100.0)

## 2018-04-14 ENCOUNTER — Ambulatory Visit: Payer: Self-pay | Attending: Family Medicine

## 2018-04-29 MED FILL — PANTOPRAZOLE SOD DR 40 MG T: 40 | 30 days supply | Qty: 30 | Fill #1

## 2018-04-29 MED FILL — ROSUVASTATIN CALCIUM 10 MG: 10 | 30 days supply | Qty: 30 | Fill #1

## 2018-04-29 MED FILL — CITALOPRAM HBR 40 MG TABLET: 40 | 30 days supply | Qty: 30 | Fill #1

## 2018-04-29 MED FILL — METOPROLOL TARTRATE 50 MG T: 50 | 30 days supply | Qty: 60 | Fill #1

## 2018-05-19 ENCOUNTER — Ambulatory Visit: Payer: Self-pay | Admitting: Nurse Practitioner

## 2018-05-26 MED FILL — ROSUVASTATIN CALCIUM 10 MG: 10 | 30 days supply | Qty: 30 | Fill #2

## 2018-05-26 MED FILL — ?CITALOPRAM HBR 40 MG TABLE: 40 | 30 days supply | Qty: 30 | Fill #2

## 2018-05-26 MED FILL — METOPROLOL TARTRATE 50 MG T: 50 | 30 days supply | Qty: 60 | Fill #2

## 2018-05-26 MED FILL — PANTOPRAZOLE SOD DR 40 MG T: 40 | 30 days supply | Qty: 30 | Fill #2

## 2018-06-14 ENCOUNTER — Telehealth: Payer: Self-pay | Admitting: Family

## 2018-06-14 DIAGNOSIS — L259 Unspecified contact dermatitis, unspecified cause: Secondary | ICD-10-CM

## 2018-06-14 DIAGNOSIS — R21 Rash and other nonspecific skin eruption: Secondary | ICD-10-CM

## 2018-06-14 MED ORDER — PREDNISONE 10 MG PO TABS: 10.0000 mg | ORAL_TABLET | Freq: Every day | ORAL | 0 refills | Status: DC

## 2018-06-14 NOTE — Progress Notes (Signed)
E Visit for Rash  We are sorry that you are not feeling well. Here is how we plan to help!  Based on what you shared with me it looks like you have contact dermatitis.  Contact dermatitis is a skin rash caused by something that touches the skin and causes irritation or inflammation.  Your skin may be red, swollen, dry, cracked, and itch.  The rash should go away in a few days but can last a few weeks.  If you get a rash, it's important to figure out what caused it so the irritant can be avoided in the future. and I have prescribed Prednisone 10 mg daily for 5 days   This does not look like shingles. It does look like you have had some type of reaction to something, whether that is poison oak or some type of jewelry? You may continue to use the fluticasone cream.   Approximately 5 minutes was spent documenting and reviewing patient's chart.   HOME CARE:   Take cool showers and avoid direct sunlight.  Apply cool compress or wet dressings.  Take a bath in an oatmeal bath.  Sprinkle content of one Aveeno packet under running faucet with comfortably warm water.  Bathe for 15-20 minutes, 1-2 times daily.  Pat dry with a towel. Do not rub the rash.  Use hydrocortisone cream.  Take an antihistamine like Benadryl for widespread rashes that itch.  The adult dose of Benadryl is 25-50 mg by mouth 4 times daily.  Caution:  This type of medication may cause sleepiness.  Do not drink alcohol, drive, or operate dangerous machinery while taking antihistamines.  Do not take these medications if you have prostate enlargement.  Read package instructions thoroughly on all medications that you take.  GET HELP RIGHT AWAY IF:   Symptoms don't go away after treatment.  Severe itching that persists.  If you rash spreads or swells.  If you rash begins to smell.  If it blisters and opens or develops a yellow-brown crust.  You develop a fever.  You have a sore throat.  You become short of  breath.  MAKE SURE YOU:  Understand these instructions. Will watch your condition. Will get help right away if you are not doing well or get worse.  Thank you for choosing an e-visit. Your e-visit answers were reviewed by a board certified advanced clinical practitioner to complete your personal care plan. Depending upon the condition, your plan could have included both over the counter or prescription medications. Please review your pharmacy choice. Be sure that the pharmacy you have chosen is open so that you can pick up your prescription now.  If there is a problem you may message your provider in MyChart to have the prescription routed to another pharmacy. Your safety is important to [place]. If you have drug allergies check your prescription carefully.  For the next 24 hours, you can use MyChart to ask questions about today's visit, request a non-urgent call back, or ask for a work or school excuse from your e-visit provider. You will get an email in the next two days asking about your experience. I hope that your e-visit has been valuable and will speed your recovery.

## 2018-06-15 MED FILL — predniSONE 10 MG TABS: 10 | 5 days supply | Qty: 5 | Fill #0

## 2018-06-25 MED FILL — ?PANTOPRAZOLE SOD DR 40MGTA: 40 | 30 days supply | Qty: 30 | Fill #3

## 2018-06-25 MED FILL — ?METOPROLOL 50 MG TABLET: 50 | 30 days supply | Qty: 60 | Fill #3

## 2018-06-25 MED FILL — ROSUVASTATIN CALCIUM 10 MG: 10 | 30 days supply | Qty: 30 | Fill #3

## 2018-06-25 MED FILL — ?CITALOPRAM HBR 40 MG TABLE: 40 | 30 days supply | Qty: 30 | Fill #3

## 2018-07-29 MED FILL — ?CITALOPRAM HBR 40 MG TABLE: 40 | 30 days supply | Qty: 30 | Fill #4

## 2018-07-29 MED FILL — ?METOPROLOL 50 MG TABLET: 50 | 30 days supply | Qty: 60 | Fill #4

## 2018-07-29 MED FILL — ?PANTOPRAZOLE SOD DR 40MGTA: 40 | 30 days supply | Qty: 30 | Fill #4

## 2018-07-29 MED FILL — ?ROSUVASTATIN CALCIUM 10 MG: 10 | 30 days supply | Qty: 30 | Fill #4

## 2018-08-27 MED FILL — ?ROSUVASTATIN CALCIUM 10 MG: 10 | 30 days supply | Qty: 30 | Fill #5

## 2018-08-27 MED FILL — ?CITALOPRAM HBR 40 MG TABLE: 40 | 30 days supply | Qty: 30 | Fill #5

## 2018-08-27 MED FILL — ?METOPROLOL 50 MG TABLET: 50 | 30 days supply | Qty: 60 | Fill #5

## 2018-08-27 MED FILL — ?PANTOPRAZOLE SO DR 40MG TA: 40 | 30 days supply | Qty: 30 | Fill #5

## 2018-09-23 MED FILL — ?ROSUVASTATIN CALCIUM 10 MG: 10 | 30 days supply | Qty: 30 | Fill #6

## 2018-09-23 MED FILL — ?PANTOPRAZOLE SO DR 40MG TA: 40 | 30 days supply | Qty: 30 | Fill #6

## 2018-09-23 MED FILL — ?METOPROLOL 50 MG TABLET: 50 | 30 days supply | Qty: 60 | Fill #6

## 2018-09-23 MED FILL — ?CITALOPRAM HBR 40 MG TABLE: 40 | 30 days supply | Qty: 30 | Fill #6

## 2018-10-13 ENCOUNTER — Other Ambulatory Visit: Payer: Self-pay

## 2018-10-13 ENCOUNTER — Ambulatory Visit: Payer: Self-pay

## 2018-10-13 ENCOUNTER — Ambulatory Visit: Payer: Self-pay | Attending: Family Medicine

## 2018-10-26 MED FILL — ?ROSUVASTATIN CALCIUM 10 MG: 10 | 30 days supply | Qty: 30 | Fill #7

## 2018-10-26 MED FILL — ?CITALOPRAM HBR 40 MG TABLE: 40 | 30 days supply | Qty: 30 | Fill #7

## 2018-10-26 MED FILL — ?PANTOPRAZOLE SO DR 40MG TA: 40 | 30 days supply | Qty: 30 | Fill #7

## 2018-10-26 MED FILL — ?METOPROLOL 50 MG TABLET: 50 | 30 days supply | Qty: 60 | Fill #7

## 2018-11-04 MED FILL — ?PANTOPRAZOLE SODI DR 40MGT: 40 | 30 days supply | Qty: 30 | Fill #7

## 2018-11-04 MED FILL — ?METOPROLOL 50 MG TABLET: 50 | 30 days supply | Qty: 60 | Fill #7

## 2018-11-04 MED FILL — ?ROSUVASTATIN CALCIUM 10 MG: 10 | 30 days supply | Qty: 30 | Fill #7

## 2018-11-04 MED FILL — ?CITALOPRAM HBR 40 MG TABLE: 40 | 30 days supply | Qty: 30 | Fill #7

## 2018-11-16 ENCOUNTER — Ambulatory Visit: Payer: Self-pay | Attending: Nurse Practitioner | Admitting: Nurse Practitioner

## 2018-11-16 ENCOUNTER — Other Ambulatory Visit: Payer: Self-pay

## 2018-11-16 ENCOUNTER — Encounter: Payer: Self-pay | Admitting: Nurse Practitioner

## 2018-11-16 ENCOUNTER — Ambulatory Visit (HOSPITAL_BASED_OUTPATIENT_CLINIC_OR_DEPARTMENT_OTHER): Payer: Self-pay | Admitting: Pharmacist

## 2018-11-16 VITALS — BP 143/87 | HR 67 | Temp 98.7°F | Ht 61.0 in | Wt 122.6 lb

## 2018-11-16 DIAGNOSIS — Z130 Encounter for screening for diseases of the blood and blood-forming organs and certain disorders involving the immune mechanism: Secondary | ICD-10-CM

## 2018-11-16 DIAGNOSIS — F329 Major depressive disorder, single episode, unspecified: Secondary | ICD-10-CM

## 2018-11-16 DIAGNOSIS — E782 Mixed hyperlipidemia: Secondary | ICD-10-CM

## 2018-11-16 DIAGNOSIS — Z23 Encounter for immunization: Secondary | ICD-10-CM

## 2018-11-16 DIAGNOSIS — Z780 Asymptomatic menopausal state: Secondary | ICD-10-CM

## 2018-11-16 DIAGNOSIS — K219 Gastro-esophageal reflux disease without esophagitis: Secondary | ICD-10-CM

## 2018-11-16 DIAGNOSIS — I10 Essential (primary) hypertension: Secondary | ICD-10-CM

## 2018-11-16 DIAGNOSIS — F419 Anxiety disorder, unspecified: Secondary | ICD-10-CM

## 2018-11-16 MED ORDER — ROSUVASTATIN CALCIUM 10 MG PO TABS: 10.0000 mg | ORAL_TABLET | Freq: Every day | ORAL | 3 refills | Status: DC

## 2018-11-16 MED ORDER — HYDROXYZINE HCL 25 MG PO TABS: 25.0000 mg | ORAL_TABLET | Freq: Three times a day (TID) | ORAL | 0 refills | Status: DC | PRN

## 2018-11-16 MED ORDER — CITALOPRAM HYDROBROMIDE 40 MG PO TABS: 40.0000 mg | ORAL_TABLET | Freq: Every day | ORAL | 3 refills | Status: DC

## 2018-11-16 MED ORDER — METOPROLOL TARTRATE 50 MG PO TABS: 50.0000 mg | ORAL_TABLET | Freq: Two times a day (BID) | ORAL | 3 refills | Status: DC

## 2018-11-16 MED ORDER — TRIAMCINOLONE ACETONIDE 0.025 % EX CREA: 1.0000 "application " | TOPICAL_CREAM | Freq: Two times a day (BID) | CUTANEOUS | 1 refills | Status: DC

## 2018-11-16 MED ORDER — PANTOPRAZOLE SODIUM 40 MG PO TBEC: 40.0000 mg | DELAYED_RELEASE_TABLET | Freq: Every day | ORAL | 3 refills | Status: DC

## 2018-11-16 MED FILL — TRIAMCINOLONE 0.025% CREAM: 0.025 | 7 days supply | Qty: 30 | Fill #0

## 2018-11-16 MED FILL — hydrOXYzine HCL 25 MG TABS: 25 | 20 days supply | Qty: 60 | Fill #0

## 2018-11-16 NOTE — Progress Notes (Addendum)
Assessment & Plan:  Linda Hughes was seen today for follow-up.  Diagnoses and all orders for this visit:  Essential hypertension -     metoprolol tartrate (LOPRESSOR) 50 MG tablet; Take 1 tablet (50 mg total) by mouth 2 (two) times daily. -     CMP14+EGFR Continue all antihypertensives as prescribed.  Remember to bring in your blood pressure log with you for your follow up appointment.  DASH/Mediterranean Diets are healthier choices for HTN.   Mixed hyperlipidemia -     rosuvastatin (CRESTOR) 10 MG tablet; Take 1 tablet (10 mg total) by mouth daily. -     Ambulatory referral to Cardiology -     Lipid panel INSTRUCTIONS: Work on a low fat, heart healthy diet and participate in regular aerobic exercise program by working out at least 150 minutes per week; 5 days a week-30 minutes per day. Avoid red meat, fried foods. junk foods, sodas, sugary drinks, unhealthy snacking, alcohol and smoking.  Drink at least 48oz of water per day and monitor your carbohydrate intake daily.    Gastroesophageal reflux disease, esophagitis presence not specified -     pantoprazole (PROTONIX) 40 MG tablet; Take 1 tablet (40 mg total) by mouth daily. INSTRUCTIONS: Avoid GERD Triggers: acidic, spicy or fried foods, caffeine, coffee, sodas,  alcohol and chocolate.  Post-menopausal -     VITAMIN D 25 Hydroxy (Vit-D Deficiency, Fractures)  Screening for deficiency anemia -     CBC  Anxiety and depression -     hydrOXYzine (ATARAX/VISTARIL) 25 MG tablet; Take 1 tablet (25 mg total) by mouth every 8 (eight) hours as needed. -     citalopram (CELEXA) 40 MG tablet; Take 1 tablet (40 mg total) by mouth daily. -     TSH  Other orders -     triamcinolone (KENALOG) 0.025 % cream; Apply 1 application topically 2 (two) times daily.    Patient has been counseled on age-appropriate routine health concerns for screening and prevention. These are reviewed and up-to-date. Referrals have been placed accordingly. Immunizations  are up-to-date or declined.    Subjective:   Chief Complaint  Patient presents with  . Follow-up    Pt. is here to follow up on hypertension.    HPI [place] 73 y.o. female presents to office today for HTN.  Essential Hypertension Well controlled. Taking metoprolol 50 mg BID. Denies chest pain, shortness of breath, palpitations, lightheadedness, dizziness, headaches or BLE edema. She does not monitor her blood pressure at home.  BP Readings from Last 3 Encounters:  11/16/18 (!) 143/87  04/03/18 136/80  12/28/15 115/74   Mixed Hyperlipidemia Taking Crestor 10 mg daily. Denies any statin intolerance or myalgias. She has a history of significant CAD. She returned to the [place] in December 1 after being in [place] taking care of her husband for 5 months. She was evaluated by a cardiologist in [place] and had a Coronary CT performed with calcium score 144 and "calcified plaques in the proximal and middle LAD with mild and insignificant narrowing of luminal diameters (25-49%)". She was switched to crestor due to stenosis. I referred her to Cardiology however unfortunately they were unable to contact her for scheduling. Will place another referral today.  Lab Results  Component Value Date   LDLCALC 115 12/28/2015   Anxiety and Depression Well controlled. She would like refills today. Denies any increased mood lability or thoughts of self harm Depression screen [facility] 2/9 11/16/2018 04/03/2018 12/28/2015 03/27/2015 12/08/2014  Decreased Interest _0 0  Down, Depressed, Hopeless _1 0  PHQ - 2 Score _2 0  Altered sleeping 1 0 1 1 -  Tired, decreased energy _3 -  Change in appetite 0 1 0 1 -  Feeling bad or failure about yourself  1 1 0 1 -  Trouble concentrating _4 -  Moving slowly or fidgety/restless 1 2 0 1 -  Suicidal thoughts 1 1 0 1 -  PHQ-9 Score _5 -   GAD 7 : Generalized Anxiety Score 04/03/2018 12/28/2015 03/27/2015  Nervous, Anxious, on Edge _6 Control/stop worrying 1 2 -  Worry too much - different things 1 0 2  Trouble relaxing 2 0 1  Restless 1 0 -  Easily annoyed or irritable _7 Afraid - awful might happen 1 0 2  Total GAD 7 Score 8 5 -    Review of Systems  Constitutional: Negative for fever, malaise/fatigue and weight loss.  HENT: Negative.  Negative for nosebleeds.   Eyes: Negative.  Negative for blurred vision, double vision and photophobia.  Respiratory: Negative.  Negative for cough and shortness of breath.   Cardiovascular: Positive for palpitations (intermittent). Negative for chest pain and leg swelling.  Gastrointestinal: Positive for heartburn. Negative for nausea and vomiting.  Musculoskeletal: Negative.  Negative for myalgias.  Skin:       Intermittent skin rashes with unidentified cause. Not currently symptomatic but requesting a refill  Neurological: Negative.  Negative for dizziness, focal weakness, seizures and headaches.  Psychiatric/Behavioral: Positive for depression. Negative for suicidal ideas. The patient is nervous/anxious.     Past Medical History:  Diagnosis Date  . GERD (gastroesophageal reflux disease)   . High blood cholesterol   . Hypertension     Past Surgical History:  Procedure Laterality Date  . CESAREAN SECTION    . COLONOSCOPY N/A 01/06/2013   Procedure: COLONOSCOPY;  Surgeon: Missy Sabins, MD;  Location: [facility];  Service: Endoscopy;  Laterality: N/A;  . ESOPHAGOGASTRODUODENOSCOPY N/A 01/06/2013   Procedure: ESOPHAGOGASTRODUODENOSCOPY (EGD);  Surgeon: Missy Sabins, MD;  Location: [facility] ENDOSCOPY;  Service: Endoscopy;  Laterality: N/A;  . GALLBLADDER SURGERY      Family History  Problem Relation Age of Onset  . Cancer Sister     Social History Reviewed with no changes to be made today.   Outpatient Medications Prior to Visit  Medication Sig Dispense Refill  . Multiple Vitamin (MULTIVITAMIN WITH MINERALS) TABS tablet Take 1 tablet by mouth daily. 90 tablet 3  .  citalopram (CELEXA) 40 MG tablet Take 1 tablet (40 mg total) by mouth daily. 90 tablet 3  . metoprolol tartrate (LOPRESSOR) 50 MG tablet Take 1 tablet (50 mg total) by mouth 2 (two) times daily. 180 tablet 3  . pantoprazole (PROTONIX) 40 MG tablet Take 1 tablet (40 mg total) by mouth daily. 90 tablet 3  . predniSONE (DELTASONE) 10 MG tablet Take 1 tablet (10 mg total) by mouth daily with breakfast. 5 tablet 0  . rosuvastatin (CRESTOR) 10 MG tablet Take 1 tablet (10 mg total) by mouth daily. 90 tablet 3  . triamcinolone (KENALOG) 0.025 % cream Apply 1 application topically 2 (two) times daily.    . hydrOXYzine (ATARAX/VISTARIL) 25 MG tablet Take 1 tablet (25 mg total) by mouth every 6 (six) hours. (Patient not taking: Reported on 11/16/2018) 10 tablet 0  No facility-administered medications prior to visit.     No Known Allergies     Objective:    BP (!) 143/87 (BP Location: Right Arm, Patient Position: Sitting, Cuff Size: Normal)   Pulse 67   Temp 98.7 F (37.1 C) (Oral)   Ht _0  (1.549 m)   Wt 122 lb 9.6 oz (55.6 kg)   SpO2 97%   BMI 23.17 kg/m  Wt Readings from Last 3 Encounters:  11/16/18 122 lb 9.6 oz (55.6 kg)  04/03/18 126 lb 12.8 oz (57.5 kg)  12/28/15 120 lb (54.4 kg)    Physical Exam Vitals signs and nursing note reviewed.  Constitutional:      Appearance: She is well-developed.  HENT:     Head: Normocephalic and atraumatic.  Neck:     Musculoskeletal: Normal range of motion.  Cardiovascular:     Rate and Rhythm: Normal rate and regular rhythm.     Heart sounds: Normal heart sounds. No murmur. No friction rub. No gallop.   Pulmonary:     Effort: Pulmonary effort is normal. No tachypnea or respiratory distress.     Breath sounds: Normal breath sounds. No decreased breath sounds, wheezing, rhonchi or rales.  Chest:     Chest wall: No tenderness.  Abdominal:     General: Bowel sounds are normal.     Palpations: Abdomen is soft.  Musculoskeletal: Normal range  of motion.  Skin:    General: Skin is warm and dry.  Neurological:     Mental Status: She is alert and oriented to person, place, and time.     Coordination: Coordination normal.  Psychiatric:        Behavior: Behavior normal. Behavior is cooperative.        Thought Content: Thought content normal.        Judgment: Judgment normal.          Patient has been counseled extensively about nutrition and exercise as well as the importance of adherence with medications and regular follow-up. The patient was given clear instructions to go to ER or return to medical center if symptoms don't improve, worsen or new problems develop. The patient verbalized understanding.   Follow-up: Return in about 6 months (around 05/16/2019).   Gildardo Pounds, FNP-BC [facility] [place], [place]   11/16/2018, 2:22 PM

## 2018-11-16 NOTE — Progress Notes (Signed)
Patient presents for vaccination against influenza and S.pneumo (Prevnar) per orders of Zelda. Consent given. Counseling provided. No contraindications exists. Vaccine administered without incident. Will coordinate with pharmacy for assistance with Prevnar.

## 2018-11-17 LAB — CMP14+EGFR
ALT: 22 IU/L (ref 0–32)
AST: 30 IU/L (ref 0–40)
Albumin/Globulin Ratio: 1.7 (ref 1.2–2.2)
Albumin: 4.6 g/dL (ref 3.7–4.7)
Alkaline Phosphatase: 79 IU/L (ref 39–117)
BUN/Creatinine Ratio: 17 (ref 12–28)
BUN: 17 mg/dL (ref 8–27)
Bilirubin Total: 0.2 mg/dL (ref 0.0–1.2)
CO2: 19 mmol/L — ABNORMAL LOW (ref 20–29)
Calcium: 9.6 mg/dL (ref 8.7–10.3)
Chloride: 103 mmol/L (ref 96–106)
Creatinine, Ser: 0.99 mg/dL (ref 0.57–1.00)
GFR calc Af Amer: 65 mL/min/{1.73_m2} (ref >59)
GFR calc non Af Amer: 57 mL/min/{1.73_m2} — ABNORMAL LOW (ref >59)
Globulin, Total: 2.7 g/dL (ref 1.5–4.5)
Glucose: 107 mg/dL — ABNORMAL HIGH (ref 65–99)
Potassium: 4.1 mmol/L (ref 3.5–5.2)
Sodium: 143 mmol/L (ref 134–144)
Total Protein: 7.3 g/dL (ref 6.0–8.5)

## 2018-11-17 LAB — CBC
Hematocrit: 41.6 % (ref 34.0–46.6)
Hemoglobin: 13.2 g/dL (ref 11.1–15.9)
MCH: 30.7 pg (ref 26.6–33.0)
MCHC: 31.7 g/dL (ref 31.5–35.7)
MCV: 97 fL (ref 79–97)
Platelets: 209 10*3/uL (ref 150–450)
RBC: 4.3 x10E6/uL (ref 3.77–5.28)
RDW: 12.4 % (ref 11.7–15.4)
WBC: 4.6 10*3/uL (ref 3.4–10.8)

## 2018-11-17 LAB — LIPID PANEL
Chol/HDL Ratio: 2.6 ratio (ref 0.0–4.4)
Cholesterol, Total: 181 mg/dL (ref 100–199)
HDL: 70 mg/dL (ref >39)
LDL Chol Calc (NIH): 84 mg/dL (ref 0–99)
Triglycerides: 158 mg/dL — ABNORMAL HIGH (ref 0–149)
VLDL Cholesterol Cal: 27 mg/dL (ref 5–40)

## 2018-11-17 LAB — TSH: TSH: 4.35 u[IU]/mL (ref 0.450–4.500)

## 2018-11-17 LAB — VITAMIN D 25 HYDROXY (VIT D DEFICIENCY, FRACTURES): Vit D, 25-Hydroxy: 60.1 ng/mL (ref 30.0–100.0)

## 2018-11-24 ENCOUNTER — Other Ambulatory Visit: Payer: Self-pay | Admitting: Nurse Practitioner

## 2018-11-24 DIAGNOSIS — F329 Major depressive disorder, single episode, unspecified: Secondary | ICD-10-CM

## 2018-11-24 DIAGNOSIS — F32A Depression, unspecified: Secondary | ICD-10-CM

## 2018-11-27 ENCOUNTER — Other Ambulatory Visit: Payer: Self-pay | Admitting: Nurse Practitioner

## 2018-11-27 DIAGNOSIS — F32A Depression, unspecified: Secondary | ICD-10-CM

## 2018-11-27 DIAGNOSIS — F329 Major depressive disorder, single episode, unspecified: Secondary | ICD-10-CM

## 2018-11-27 MED FILL — PANTOPRAZOLE SOD DR 40 MG T: 40 | 30 days supply | Qty: 30 | Fill #0

## 2018-11-27 MED FILL — ?CITALOPRAM HBR 40 MG TABLE: 40 | 30 days supply | Qty: 30 | Fill #0

## 2018-11-27 MED FILL — METOPROLOL TARTRATE 50 MG T: 50 | 30 days supply | Qty: 60 | Fill #0

## 2018-11-27 MED FILL — ?ROSUVASTATIN CALCIUM 10 MG: 10 | 30 days supply | Qty: 30 | Fill #0

## 2018-12-15 ENCOUNTER — Telehealth: Payer: Self-pay | Admitting: *Deleted

## 2018-12-15 NOTE — Telephone Encounter (Signed)
A message was left, re: her new patient appointment. 

## 2018-12-22 ENCOUNTER — Other Ambulatory Visit: Payer: Self-pay | Admitting: Registered"

## 2018-12-22 DIAGNOSIS — Z20822 Contact with and (suspected) exposure to covid-19: Secondary | ICD-10-CM

## 2018-12-24 LAB — NOVEL CORONAVIRUS, NAA: SARS-CoV-2, NAA: NOT DETECTED

## 2018-12-25 ENCOUNTER — Encounter: Payer: Self-pay | Admitting: *Deleted

## 2018-12-28 MED FILL — ?CITALOPRAM HBR 40 MG TABLE: 40 | 30 days supply | Qty: 30 | Fill #1

## 2018-12-28 MED FILL — ?METOPROLOL 50 MG TABLET: 50 | 30 days supply | Qty: 60 | Fill #1

## 2018-12-28 MED FILL — ?ROSUVASTATIN CALCIUM 10 MG: 10 | 30 days supply | Qty: 30 | Fill #1

## 2018-12-28 MED FILL — PANTOPRAZOLE SOD DR 40 MG T: 40 | 30 days supply | Qty: 30 | Fill #1

## 2019-01-20 NOTE — Progress Notes (Signed)
Cardiology Office Note:   Date:  01/21/2019  NAME:  Linda Hughes    MRN: [id number] DOB:  02-05-1946   PCP:  Claiborne Rigg, NP  Cardiologist:  No primary care provider on file.  Electrophysiologist:  None   Referring MD: Claiborne Rigg, NP   Chief Complaint  Patient presents with  . Coronary Artery Disease   History of Present Illness:   Linda Hughes is a 73 y.o. female with a hx of hypertension, GERD, hyperlipidemia, non-obstructive CAD who is being seen today for the evaluation of CAD/HLD at the request of Claiborne Rigg, NP. She had a cardiac CTA peformed in [place] (01/10/2018, records in [place]) that showed CAC score 144 and mild calcified plaque (25-49%) in the proximal and mid LAD. Recent lab work shows T chol 184, HDL 70, LDL 84.  She reports she has been back and forth from [place] recently.  Her husband still lives there.  She has 2 children who live in [place], one here in 1 [place].  Her primary residence is here.  She reports she has been doing fairly well over the past few months but does get occasional chest pain and a sensation of a bounding pulse.  She reports significant anxiety and stress with the coronavirus pandemic.  She also reports she is extremely stressed about the situation [place].  I have reviewed her cardiac CTA which showed nonobstructive CAD.  Her most recent LDL cholesterol was 84.  I did discuss with her the possibility of increasing her Crestor today but she would like to try diet and exercise.  She states she is not left her house in months.  I have encouraged her to get out and exercise a bit and spaces where she feels safe.  I reassured her that it is safe to not wear her mask when she is in a park or an area by herself.  The symptoms she does get appear to be sharp left-sided chest pain last seconds.  She also can get a sensation of palpitations periodically.  She reports that she is extremely anxious and probably depressed.  She does not  smoke, or consume alcohol.  No illicit drug use.  No strong family history of CAD.  Past Medical History: Past Medical History:  Diagnosis Date  . Coronary artery disease   . GERD (gastroesophageal reflux disease)   . High blood cholesterol   . Hypertension     Past Surgical History: Past Surgical History:  Procedure Laterality Date  . CESAREAN SECTION    . COLONOSCOPY N/A 01/06/2013   Procedure: COLONOSCOPY;  Surgeon: Barrie Folk, MD;  Location[facility] END[facility]COPY;  Service: Endoscopy;  Laterality: N/A;  . ESOPHAGOGASTRODUODENOSCOPY N/A 01/06/2013   Procedure: ESOPHAGOGASTRODUODENOSCOPY (EGD);  Surgeon: Barrie Folk, MD;  Location: [facility] ENDOSCOPY;  Service: Endoscopy;  Laterality: N/A;  . GALLBLADDER SURGERY      Current Medications: Current Meds  Medication Sig  . aspirin EC 81 MG tablet Take 81 mg by mouth daily.  . citalopram (CELEXA) 40 MG tablet Take 1 tablet (40 mg total) by mouth daily.  . hydrOXYzine (ATARAX/VISTARIL) 25 MG tablet TAKE 1 TABLET (25 MG TOTAL) BY MOUTH EVERY 8 (EIGHT) HOURS AS NEEDED.  . metoprolol tartrate (LOPRESSOR) 50 MG tablet Take 1 tablet (50 mg total) by mouth 2 (two) times daily.  . Multiple Vitamin (MULTIVITAMIN WITH MINERALS) TABS tablet Take 1 tablet by mouth daily.  . pantoprazole (PROTONIX) 40 MG  tablet Take 1 tablet (40 mg total) by mouth daily.  . rosuvastatin (CRESTOR) 10 MG tablet Take 1 tablet (10 mg total) by mouth daily.  Marland Kitchen triamcinolone (KENALOG) 0.025 % cream Apply 1 application topically 2 (two) times daily.     Allergies:    Patient has no known allergies.   Social History: Social History   Socioeconomic History  . Marital status: Married    Spouse name: Not on file  . Number of children: Not on file  . Years of education: Not on file  . Highest education level: Not on file  Occupational History  . Not on file  Social Needs  . Financial resource strain: Not on file  . Food insecurity    Worry: Not on file    Inability: Not  on file  . Transportation needs    Medical: Not on file    Non-medical: Not on file  Tobacco Use  . Smoking status: Never Smoker  . Smokeless tobacco: Never Used  Substance and Sexual Activity  . Alcohol use: No  . Drug use: No  . Sexual activity: Not on file  Lifestyle  . Physical activity    Days per week: Not on file    Minutes per session: Not on file  . Stress: Not on file  Relationships  . Social Herbalist on phone: Not on file    Gets together: Not on file    Attends religious service: Not on file    Active member of club or organization: Not on file    Attends meetings of clubs or organizations: Not on file    Relationship status: Not on file  Other Topics Concern  . Not on file  Social History Narrative  . Not on file     Family History: The patient's family history includes Cancer in her sister.  ROS:   All other ROS reviewed and negative. Pertinent positives noted in the HPI.     EKGs/Labs/Other Studies Reviewed:   The following studies were personally reviewed by me today:  EKG:  EKG is ordered today.  The ekg ordered today demonstrates normal sinus rhythm, heart rate 64, normal intervals, nonspecific ST-T changes noted, no evidence of prior infarction, and was personally reviewed by me.   Recent Labs: 11/16/2018: ALT 22; BUN 17; Creatinine, Ser 0.99; Hemoglobin 13.2; Platelets 209; Potassium 4.1; Sodium 143; TSH 4.350   Recent Lipid Panel    Component Value Date/Time   CHOL 181 11/16/2018 1425   TRIG 158 (H) 11/16/2018 1425   HDL 70 11/16/2018 1425   CHOLHDL 2.6 11/16/2018 1425   CHOLHDL 2.7 12/28/2015 0949   VLDL 18 12/28/2015 0949   LDLCALC 84 11/16/2018 1425    Physical Exam:   VS:  BP (!) 148/102   Pulse 64   Temp (!) 97 F (36.1 C)   Ht 5\' 1"  (1.549 m)   Wt 121 lb 3.2 oz (55 kg)   SpO2 97%   BMI 22.90 kg/m    Wt Readings from Last 3 Encounters:  01/21/19 121 lb 3.2 oz (55 kg)  11/16/18 122 lb 9.6 oz (55.6 kg)  04/03/18  126 lb 12.8 oz (57.5 kg)    General: Well nourished, well developed, in no acute distress Heart: Atraumatic, normal size  Eyes: PEERLA, EOMI  Neck: Supple, no JVD Endocrine: No thryomegaly Cardiac: Normal S1, S2; RRR; no murmurs, rubs, or gallops Lungs: Clear to auscultation bilaterally, no wheezing, rhonchi or rales  Abd:  Soft, nontender, no hepatomegaly  Ext: No edema, pulses 2+ Musculoskeletal: No deformities, BUE and BLE strength normal and equal Skin: Warm and dry, no rashes   Neuro: Alert and oriented to person, place, time, and situation, CNII-XII grossly intact, no focal deficits  Psych: Normal mood and affect   ASSESSMENT:   Linda Hughes is a 73 y.o. female who presents for the following: 1. Coronary artery disease involving native coronary artery of native heart without angina pectoris   2. Mixed hyperlipidemia   3. Essential hypertension     PLAN:   1. Coronary artery disease involving native coronary artery of native heart without angina pectoris 2. Mixed hyperlipidemia 3. Essential hypertension -Cardiac CTA with nonobstructive CAD.  This was performed in [place] it in our records.  She has no symptoms concerning for angina.  She does get occasional sharp left-sided chest pain and palpitations.  I think this is all anxiety related due to the coronavirus pandemic and what is going on [place].  I have encouraged her to resume exercise and to get outside and get pressure.  I also encouraged her to consume occasional wine as this will help her stress level.  We will see her back in 3 months for fasting lipid profile.  I would like to get her LDL cholesterol less than 70.  I think she can get better with diet and exercise and she is requested not to increase her Crestor which is okay.  Her blood pressure also is a bit elevated today but appears to be well controlled on review.  She will continue metoprolol tartrate 50 mg twice a day.  If her blood pressure remains elevated when  I see her back we will add Norvasc to get better control.   Disposition: Return in about 3 months (around 04/23/2019).  Medication Adjustments/Labs and Tests Ordered: Current medicines are reviewed at length with the patient today.  Concerns regarding medicines are outlined above.  Orders Placed This Encounter  Procedures  . Lipid panel  . EKG 12-Lead   No orders of the defined types were placed in this encounter.   Patient Instructions  Medication Instructions:  The current medical regimen is effective;  continue present plan and medications.  *If you need a refill on your cardiac medications before your next appointment, please call your pharmacy*  Lab Work: LIPID in 3 months. Attached are the lab orders that are needed before your upcoming appointment.  They are fasting labs, so nothing to eat or drink after midnight.  Lab hours: 8:00-4:00 lunch hours 12:45-1:45  If you have labs (blood work) drawn today and your tests are completely normal, you will receive your results only by: Marland Kitchen. MyChart Message (if you have MyChart) OR . A paper copy in the mail If you have any lab test that is abnormal or we need to change your treatment, we will call you to review the results.  Follow-Up: At [facility], you and your health needs are our priority.  As part of our continuing mission to provide you with exceptional heart care, we have created designated Provider Care Teams.  These Care Teams include your primary Cardiologist (physician) and Advanced Practice Providers (APPs -  Physician Assistants and Nurse Practitioners) who all work together to provide you with the care you need, when you need it.  Your next appointment:   3 month(s)  The format for your next appointment:   In Person  Provider:   Lennie OdorWesley O'Neal, MD  Signed, Lenna Gilford. Flora Lipps, MD [facility]  [address], [address] [place], [place] [address] [phone]  01/21/2019 9:14 AM

## 2019-01-21 ENCOUNTER — Encounter: Payer: Self-pay | Admitting: Cardiovascular Disease

## 2019-01-21 ENCOUNTER — Ambulatory Visit (INDEPENDENT_AMBULATORY_CARE_PROVIDER_SITE_OTHER): Payer: No Typology Code available for payment source | Admitting: Cardiovascular Disease

## 2019-01-21 ENCOUNTER — Other Ambulatory Visit: Payer: Self-pay

## 2019-01-21 VITALS — BP 148/102 | HR 64 | Temp 97.0°F | Ht 61.0 in | Wt 121.2 lb

## 2019-01-21 DIAGNOSIS — E782 Mixed hyperlipidemia: Secondary | ICD-10-CM

## 2019-01-21 DIAGNOSIS — I2510 Atherosclerotic heart disease of native coronary artery without angina pectoris: Secondary | ICD-10-CM

## 2019-01-21 DIAGNOSIS — I10 Essential (primary) hypertension: Secondary | ICD-10-CM

## 2019-01-21 NOTE — Patient Instructions (Signed)
Medication Instructions:  The current medical regimen is effective;  continue present plan and medications.  *If you need a refill on your cardiac medications before your next appointment, please call your pharmacy*  Lab Work: LIPID in 3 months. Attached are the lab orders that are needed before your upcoming appointment.  They are fasting labs, so nothing to eat or drink after midnight.  Lab hours: 8:00-4:00 lunch hours 12:45-1:45  If you have labs (blood work) drawn today and your tests are completely normal, you will receive your results only by: Marland Kitchen MyChart Message (if you have MyChart) OR . A paper copy in the mail If you have any lab test that is abnormal or we need to change your treatment, we will call you to review the results.  Follow-Up: At [facility], you and your health needs are our priority.  As part of our continuing mission to provide you with exceptional heart care, we have created designated Provider Care Teams.  These Care Teams include your primary Cardiologist (physician) and Advanced Practice Providers (APPs -  Physician Assistants and Nurse Practitioners) who all work together to provide you with the care you need, when you need it.  Your next appointment:   3 month(s)  The format for your next appointment:   In Person  Provider:   Eleonore Chiquito, MD

## 2019-01-26 MED FILL — ?ROSUVASTATIN CALCIUM 10 MG: 10 | 30 days supply | Qty: 30 | Fill #2

## 2019-01-26 MED FILL — PANTOPRAZOLE SOD DR 40 MG T: 40 | 30 days supply | Qty: 30 | Fill #2

## 2019-01-26 MED FILL — ?CITALOPRAM HBR 40 MG TABLE: 40 | 30 days supply | Qty: 30 | Fill #2

## 2019-02-22 MED FILL — PANTOPRAZOLE SOD DR 40 MG T: 40 | 30 days supply | Qty: 30 | Fill #3

## 2019-02-22 MED FILL — ?METOPROLOL 50 MG TABLET: 50 | 30 days supply | Qty: 60 | Fill #3

## 2019-02-22 MED FILL — ?CITALOPRAM HBR 40 MG TABLE: 40 | 30 days supply | Qty: 30 | Fill #3

## 2019-02-22 MED FILL — ?ROSUVASTATIN CALCIUM 10 MG: 10 | 30 days supply | Qty: 30 | Fill #3

## 2019-03-25 MED FILL — PANTOPRAZOLE SOD DR 40 MG T: 40 | 30 days supply | Qty: 30 | Fill #4

## 2019-03-25 MED FILL — ?METOPROLOL 50 MG TABLET: 50 | 30 days supply | Qty: 60 | Fill #4

## 2019-03-25 MED FILL — ?CITALOPRAM HBR 40 MG TABLE: 40 | 30 days supply | Qty: 30 | Fill #4

## 2019-03-25 MED FILL — ?ROSUVASTATIN CALCIUM 10 MG: 10 | 30 days supply | Qty: 30 | Fill #4

## 2019-04-07 ENCOUNTER — Ambulatory Visit: Payer: No Typology Code available for payment source

## 2019-04-21 LAB — LIPID PANEL
Chol/HDL Ratio: 2.4 ratio (ref 0.0–4.4)
Cholesterol, Total: 170 mg/dL (ref 100–199)
HDL: 70 mg/dL (ref >39)
LDL Chol Calc (NIH): 84 mg/dL (ref 0–99)
Triglycerides: 84 mg/dL (ref 0–149)
VLDL Cholesterol Cal: 16 mg/dL (ref 5–40)

## 2019-04-23 ENCOUNTER — Ambulatory Visit: Payer: No Typology Code available for payment source | Admitting: Cardiovascular Disease

## 2019-04-25 NOTE — Progress Notes (Signed)
Cardiology Office Note:   Date:  04/28/2019  NAME:  Linda Hughes    MRN: [id number] DOB:  [birth year]   PCP:  Gildardo Pounds, NP  Cardiologist:  No primary care provider on file.   Referring MD: Gildardo Pounds, NP   Chief Complaint  Patient presents with  . Coronary Artery Disease   History of Present Illness:   Linda Hughes is a 74 y.o. female with a hx of nonobstructive CAD, hypertension, hyperlipidemia who presents for follow-up of CAD.  Blood pressure bit elevated but she has a long from home.  Her blood pressure is within normal limits at home.  She does get a bit nervous with the stress of coming to the doctor.  Her most recent lipid profile shows an LDL of 84.  This is okay given that her HDL cholesterol is 70.  I think CAD will never be a big issue for her.  She is active and does exercise daily.  She walks up to 4000 steps without any chest pain or shortness of breath.  Occasionally she can get a sharp sensation in her chest when walking upstairs.  Last seconds and goes away.  She also reports that getting back into activity level after the quarantine has been a bit difficult.  She still is able to maintain a high level of activity.  She is still stressed about the overall coronavirus pandemic as well as events that have occurred in [place].  She reports that her family there gives her a lot of stress due to the uncertainty of the situation there.  Overall, she appears to be doing well.  Increased crestor BP  Problem List 1. CAD -CCTA [place] (01/10/2018) -mild CAD 25-49% pLAD 2. HLD -T chol 170, HDL 70, LDL 84, TG 84 3. HTN  Past Medical History: Past Medical History:  Diagnosis Date  . Coronary artery disease   . GERD (gastroesophageal reflux disease)   . High blood cholesterol   . Hypertension     Past Surgical History: Past Surgical History:  Procedure Laterality Date  . CESAREAN SECTION    . COLONOSCOPY N/A 01/06/2013   Procedure: COLONOSCOPY;  Surgeon:  Missy Sabins, MD;  Location: [place];  Service: Endoscopy;  Laterality: N/A;  . ESOPHAGOGASTRODUODENOSCOPY N/A 01/06/2013   Procedure: ESOPHAGOGASTRODUODENOSCOPY (EGD);  Surgeon: Missy Sabins, MD;  Location: [facility] ENDOSCOPY;  Service: Endoscopy;  Laterality: N/A;  . GALLBLADDER SURGERY      Current Medications: Current Meds  Medication Sig  . aspirin EC 81 MG tablet Take 81 mg by mouth daily.  . citalopram (CELEXA) 40 MG tablet Take 1 tablet (40 mg total) by mouth daily.  . hydrOXYzine (ATARAX/VISTARIL) 25 MG tablet TAKE 1 TABLET (25 MG TOTAL) BY MOUTH EVERY 8 (EIGHT) HOURS AS NEEDED.  . metoprolol tartrate (LOPRESSOR) 50 MG tablet Take 1 tablet (50 mg total) by mouth 2 (two) times daily.  . Multiple Vitamin (MULTIVITAMIN WITH MINERALS) TABS tablet Take 1 tablet by mouth daily.  . pantoprazole (PROTONIX) 40 MG tablet Take 1 tablet (40 mg total) by mouth daily.  . rosuvastatin (CRESTOR) 10 MG tablet Take 1 tablet (10 mg total) by mouth daily.  Marland Kitchen triamcinolone (KENALOG) 0.025 % cream Apply 1 application topically 2 (two) times daily.     Allergies:    Patient has no known allergies.   Social History: Social History   Socioeconomic History  . Marital status: Married    Spouse name: Not on  file  . Number of children: Not on file  . Years of education: Not on file  . Highest education level: Not on file  Occupational History  . Not on file  Tobacco Use  . Smoking status: Never Smoker  . Smokeless tobacco: Never Used  Substance and Sexual Activity  . Alcohol use: No  . Drug use: No  . Sexual activity: Not on file  Other Topics Concern  . Not on file  Social History Narrative  . Not on file   Social Determinants of Health   Financial Resource Strain:   . Difficulty of Paying Living Expenses: Not on file  Food Insecurity:   . Worried About Programme researcher, broadcasting/film/video in the Last Year: Not on file  . Ran Out of Food in the Last Year: Not on file  Transportation Needs:   . Lack of  Transportation (Medical): Not on file  . Lack of Transportation (Non-Medical): Not on file  Physical Activity:   . Days of Exercise per Week: Not on file  . Minutes of Exercise per Session: Not on file  Stress:   . Feeling of Stress : Not on file  Social Connections:   . Frequency of Communication with Friends and Family: Not on file  . Frequency of Social Gatherings with Friends and Family: Not on file  . Attends Religious Services: Not on file  . Active Member of Clubs or Organizations: Not on file  . Attends Banker Meetings: Not on file  . Marital Status: Not on file     Family History: The patient's family history includes Cancer in her sister.  ROS:   All other ROS reviewed and negative. Pertinent positives noted in the HPI.     EKGs/Labs/Other Studies Reviewed:   The following studies were personally reviewed by me today:  EKG:  EKG is ordered today.  The ekg ordered today demonstrates normal sinus rhythm, heart rate 60, no acute ST-T changes, no evidence of prior infarction, and was personally reviewed by me.   Recent Labs: 11/16/2018: ALT 22; BUN 17; Creatinine, Ser 0.99; Hemoglobin 13.2; Platelets 209; Potassium 4.1; Sodium 143; TSH 4.350   Recent Lipid Panel    Component Value Date/Time   CHOL 170 04/21/2019 0901   TRIG 84 04/21/2019 0901   HDL 70 04/21/2019 0901   CHOLHDL 2.4 04/21/2019 0901   CHOLHDL 2.7 12/28/2015 0949   VLDL 18 12/28/2015 0949   LDLCALC 84 04/21/2019 0901    Physical Exam:   VS:  BP (!) 142/90   Pulse 60   Ht 5\' 1"  (1.549 m)   Wt 119 lb 6.4 oz (54.2 kg)   SpO2 95%   BMI 22.56 kg/m    Wt Readings from Last 3 Encounters:  04/28/19 119 lb 6.4 oz (54.2 kg)  01/21/19 121 lb 3.2 oz (55 kg)  11/16/18 122 lb 9.6 oz (55.6 kg)    General: Well nourished, well developed, in no acute distress Heart: Atraumatic, normal size  Eyes: PEERLA, EOMI  Neck: Supple, no JVD Endocrine: No thryomegaly Cardiac: Normal S1, S2; RRR; no  murmurs, rubs, or gallops Lungs: Clear to auscultation bilaterally, no wheezing, rhonchi or rales  Abd: Soft, nontender, no hepatomegaly  Ext: No edema, pulses 2+ Musculoskeletal: No deformities, BUE and BLE strength normal and equal Skin: Warm and dry, no rashes   Neuro: Alert and oriented to person, place, time, and situation, CNII-XII grossly intact, no focal deficits  Psych: Normal mood and affect  ASSESSMENT:   Linda Hughes is a 74 y.o. female who presents for the following: 1. Coronary artery disease involving native coronary artery of native heart without angina pectoris   2. Mixed hyperlipidemia   3. Essential hypertension     PLAN:   1. Coronary artery disease involving native coronary artery of native heart without angina pectoris -Nonobstructive CAD on a cardiac CTA in [place] 01/2018 -No symptoms to suggest angina -LDL is okay at 84, HDL is 70 -We will continue aspirin and Crestor  2. Mixed hyperlipidemia -Continue Crestor  3. Essential hypertension -BP a bit elevated when she sees doctors in office.  Log at home is normal.  No change in medications.  Disposition: Return in about 1 year (around 04/27/2020).  Medication Adjustments/Labs and Tests Ordered: Current medicines are reviewed at length with the patient today.  Concerns regarding medicines are outlined above.  Orders Placed This Encounter  Procedures  . EKG 12-Lead   No orders of the defined types were placed in this encounter.   Patient Instructions  Medication Instructions:  The current medical regimen is effective;  continue present plan and medications.  *If you need a refill on your cardiac medications before your next appointment, please call your pharmacy*  Follow-Up: At [facility], you and your health needs are our priority.  As part of our continuing mission to provide you with exceptional heart care, we have created designated Provider Care Teams.  These Care Teams include your  primary Cardiologist (physician) and Advanced Practice Providers (APPs -  Physician Assistants and Nurse Practitioners) who all work together to provide you with the care you need, when you need it.  Your next appointment:   12 month(s)  The format for your next appointment:   In Person  Provider:   Lennie Odor, MD        Signed, Lenna Gilford. Flora Lipps, MD [facility]  [address] [place], [place] [address] [phone]  04/28/2019 10:16 AM

## 2019-04-27 ENCOUNTER — Other Ambulatory Visit: Payer: Self-pay

## 2019-04-27 ENCOUNTER — Ambulatory Visit: Payer: Self-pay

## 2019-04-27 MED FILL — ?CITALOPRAM HBR 40 MG TABLE: 40 | 30 days supply | Qty: 30 | Fill #5

## 2019-04-27 MED FILL — ?METOPROLOL TART 50 MG TABL: 50 | 30 days supply | Qty: 60 | Fill #5

## 2019-04-27 MED FILL — ?PANTOPRAZOLE SO DR 40MG TA: 40 | 30 days supply | Qty: 30 | Fill #5

## 2019-04-27 MED FILL — ?ROSUVASTATIN CALCIUM 10 MG: 10 | 30 days supply | Qty: 30 | Fill #5

## 2019-04-28 ENCOUNTER — Telehealth: Payer: Self-pay | Admitting: Nurse Practitioner

## 2019-04-28 ENCOUNTER — Encounter: Payer: Self-pay | Admitting: Cardiovascular Disease

## 2019-04-28 ENCOUNTER — Ambulatory Visit (INDEPENDENT_AMBULATORY_CARE_PROVIDER_SITE_OTHER): Payer: No Typology Code available for payment source | Admitting: Cardiovascular Disease

## 2019-04-28 VITALS — BP 142/90 | HR 60 | Ht 61.0 in | Wt 119.4 lb

## 2019-04-28 DIAGNOSIS — I10 Essential (primary) hypertension: Secondary | ICD-10-CM

## 2019-04-28 DIAGNOSIS — E782 Mixed hyperlipidemia: Secondary | ICD-10-CM

## 2019-04-28 DIAGNOSIS — I2510 Atherosclerotic heart disease of native coronary artery without angina pectoris: Secondary | ICD-10-CM

## 2019-04-28 NOTE — Patient Instructions (Signed)
Medication Instructions:  The current medical regimen is effective;  continue present plan and medications.  *If you need a refill on your cardiac medications before your next appointment, please call your pharmacy*  Follow-Up: At [facility], you and your health needs are our priority.  As part of our continuing mission to provide you with exceptional heart care, we have created designated Provider Care Teams.  These Care Teams include your primary Cardiologist (physician) and Advanced Practice Providers (APPs -  Physician Assistants and Nurse Practitioners) who all work together to provide you with the care you need, when you need it.  Your next appointment:   12 month(s)  The format for your next appointment:   In Person  Provider:   Providence O'Neal, MD    

## 2019-04-28 NOTE — Telephone Encounter (Signed)
Received and email from [facility] about, Patient would need to provide letter with the address of the person that is providing support (see below).  Will hold account for 14 days for patient to provide corrected letter of support  , Pt was inform and will bring a new letter as soon she can

## 2019-05-14 ENCOUNTER — Ambulatory Visit: Payer: No Typology Code available for payment source | Admitting: Nurse Practitioner

## 2019-05-26 ENCOUNTER — Telehealth: Payer: Self-pay | Admitting: Nurse Practitioner

## 2019-05-26 ENCOUNTER — Ambulatory Visit: Payer: No Typology Code available for payment source | Admitting: Nurse Practitioner

## 2019-05-26 NOTE — Telephone Encounter (Signed)
Patient wants to talk to you regarding her labs and she also have a personal questions  Please, called her back . Thank you  .

## 2019-05-27 MED FILL — ?CITALOPRAM HBR 40 MG TABLE: 40 | 30 days supply | Qty: 30 | Fill #6

## 2019-05-27 MED FILL — ?PANTOPRAZOLE SO DR 40MG TA: 40 | 30 days supply | Qty: 30 | Fill #6

## 2019-05-27 MED FILL — ?ROSUVASTATIN CALCIUM 10 MG: 10 | 30 days supply | Qty: 30 | Fill #6

## 2019-05-27 MED FILL — ?METOPROLOL TART 50 MG TABL: 50 | 30 days supply | Qty: 60 | Fill #6

## 2019-05-31 NOTE — Telephone Encounter (Signed)
Pt. Would to have her diabetes screening.  Scheduled patient for a lab appt.

## 2019-06-01 ENCOUNTER — Other Ambulatory Visit: Payer: Self-pay

## 2019-06-01 ENCOUNTER — Ambulatory Visit: Payer: No Typology Code available for payment source | Attending: Nurse Practitioner

## 2019-06-01 DIAGNOSIS — E782 Mixed hyperlipidemia: Secondary | ICD-10-CM

## 2019-06-01 DIAGNOSIS — I10 Essential (primary) hypertension: Secondary | ICD-10-CM

## 2019-06-01 DIAGNOSIS — Z130 Encounter for screening for diseases of the blood and blood-forming organs and certain disorders involving the immune mechanism: Secondary | ICD-10-CM

## 2019-06-01 DIAGNOSIS — Z131 Encounter for screening for diabetes mellitus: Secondary | ICD-10-CM

## 2019-06-02 LAB — CMP14+EGFR
ALT: 46 IU/L — ABNORMAL HIGH (ref 0–32)
AST: 48 IU/L — ABNORMAL HIGH (ref 0–40)
Albumin/Globulin Ratio: 1.9 (ref 1.2–2.2)
Albumin: 4.6 g/dL (ref 3.7–4.7)
Alkaline Phosphatase: 85 IU/L (ref 39–117)
BUN/Creatinine Ratio: 17 (ref 12–28)
BUN: 18 mg/dL (ref 8–27)
Bilirubin Total: 0.3 mg/dL (ref 0.0–1.2)
CO2: 26 mmol/L (ref 20–29)
Calcium: 9.9 mg/dL (ref 8.7–10.3)
Chloride: 106 mmol/L (ref 96–106)
Creatinine, Ser: 1.04 mg/dL — ABNORMAL HIGH (ref 0.57–1.00)
GFR calc Af Amer: 61 mL/min/{1.73_m2} (ref >59)
GFR calc non Af Amer: 53 mL/min/{1.73_m2} — ABNORMAL LOW (ref >59)
Globulin, Total: 2.4 g/dL (ref 1.5–4.5)
Glucose: 72 mg/dL (ref 65–99)
Potassium: 4.6 mmol/L (ref 3.5–5.2)
Sodium: 142 mmol/L (ref 134–144)
Total Protein: 7 g/dL (ref 6.0–8.5)

## 2019-06-02 LAB — CBC
Hematocrit: 41.5 % (ref 34.0–46.6)
Hemoglobin: 13.6 g/dL (ref 11.1–15.9)
MCH: 30.5 pg (ref 26.6–33.0)
MCHC: 32.8 g/dL (ref 31.5–35.7)
MCV: 93 fL (ref 79–97)
Platelets: 176 10*3/uL (ref 150–450)
RBC: 4.46 x10E6/uL (ref 3.77–5.28)
RDW: 11.8 % (ref 11.7–15.4)
WBC: 4 10*3/uL (ref 3.4–10.8)

## 2019-06-02 LAB — HEMOGLOBIN A1C
Est. average glucose Bld gHb Est-mCnc: 123 mg/dL
Hgb A1c MFr Bld: 5.9 % — ABNORMAL HIGH (ref 4.8–5.6)

## 2019-06-28 MED FILL — ROSUVASTATIN CALCIUM 10 MG: 10 | 30 days supply | Qty: 30 | Fill #7

## 2019-06-28 MED FILL — ?CITALOPRAM HBR 40 MG TABLE: 40 | 30 days supply | Qty: 30 | Fill #7

## 2019-06-28 MED FILL — ?METOPROLOL TART 50 MG TABL: 50 | 30 days supply | Qty: 60 | Fill #7

## 2019-06-28 MED FILL — ?PANTOPRAZOLE SO DR 40MG TA: 40 | 30 days supply | Qty: 30 | Fill #7

## 2019-07-27 MED FILL — ?METOPROLOL TART 50 MG TABL: 50 | 30 days supply | Qty: 60 | Fill #8

## 2019-07-27 MED FILL — ?PANTOPRAZOLE SO DR 40MG TA: 40 | 30 days supply | Qty: 30 | Fill #8

## 2019-07-27 MED FILL — ?ROSUVASTATIN CALCIUM 10 MG: 10 | 30 days supply | Qty: 30 | Fill #8

## 2019-07-27 MED FILL — ?CITALOPRAM HBR 40 MG TABLE: 40 | 30 days supply | Qty: 30 | Fill #8

## 2019-08-26 ENCOUNTER — Ambulatory Visit: Payer: Self-pay | Attending: Nurse Practitioner | Admitting: Physician Assistant

## 2019-08-26 ENCOUNTER — Other Ambulatory Visit: Payer: Self-pay

## 2019-08-26 ENCOUNTER — Other Ambulatory Visit: Payer: Self-pay | Admitting: Physician Assistant

## 2019-08-26 VITALS — BP 143/85 | HR 61 | Temp 97.7°F | Ht 61.0 in | Wt 110.0 lb

## 2019-08-26 DIAGNOSIS — I10 Essential (primary) hypertension: Secondary | ICD-10-CM

## 2019-08-26 DIAGNOSIS — R3915 Urgency of urination: Secondary | ICD-10-CM

## 2019-08-26 DIAGNOSIS — F419 Anxiety disorder, unspecified: Secondary | ICD-10-CM

## 2019-08-26 DIAGNOSIS — K219 Gastro-esophageal reflux disease without esophagitis: Secondary | ICD-10-CM

## 2019-08-26 DIAGNOSIS — E782 Mixed hyperlipidemia: Secondary | ICD-10-CM

## 2019-08-26 DIAGNOSIS — R7303 Prediabetes: Secondary | ICD-10-CM

## 2019-08-26 DIAGNOSIS — F329 Major depressive disorder, single episode, unspecified: Secondary | ICD-10-CM

## 2019-08-26 LAB — POCT URINALYSIS DIP (CLINITEK)
Bilirubin, UA: NEGATIVE
Blood, UA: NEGATIVE
Glucose, UA: NEGATIVE mg/dL
Ketones, POC UA: NEGATIVE mg/dL
Leukocytes, UA: NEGATIVE
Nitrite, UA: NEGATIVE
POC PROTEIN,UA: NEGATIVE
Spec Grav, UA: 1.015 (ref 1.010–1.025)
Urobilinogen, UA: 0.2 E.U./dL
pH, UA: 6.5 (ref 5.0–8.0)

## 2019-08-26 LAB — GLUCOSE, POCT (MANUAL RESULT ENTRY): POC Glucose: 132 mg/dl — AB (ref 70–99)

## 2019-08-26 MED ORDER — CITALOPRAM HYDROBROMIDE 40 MG PO TABS: 40.0000 mg | ORAL_TABLET | Freq: Every day | ORAL | 3 refills | Status: DC

## 2019-08-26 MED ORDER — ROSUVASTATIN CALCIUM 10 MG PO TABS: 10.0000 mg | ORAL_TABLET | Freq: Every day | ORAL | 3 refills | Status: DC

## 2019-08-26 MED ORDER — METOPROLOL TARTRATE 50 MG PO TABS: 50.0000 mg | ORAL_TABLET | Freq: Two times a day (BID) | ORAL | 3 refills | Status: DC

## 2019-08-26 MED ORDER — PANTOPRAZOLE SODIUM 40 MG PO TBEC: 40.0000 mg | DELAYED_RELEASE_TABLET | Freq: Every day | ORAL | 3 refills | Status: DC

## 2019-08-26 MED FILL — ?CITALOPRAM HBR 40 MG TABLE: 40 | 30 days supply | Qty: 30 | Fill #9

## 2019-08-26 MED FILL — ?METOPROLOL TART 50 MG TABL: 50 | 30 days supply | Qty: 60 | Fill #9

## 2019-08-26 MED FILL — ?ROSUVASTATIN CALCIUM 10 MG: 10 | 30 days supply | Qty: 30 | Fill #9

## 2019-08-26 MED FILL — ?PANTOPRAZOLE SO DR 40MG TA: 40 | 30 days supply | Qty: 30 | Fill #9

## 2019-08-26 NOTE — Progress Notes (Signed)
Patient ID: Linda Hughes, female   DOB: 1946-02-04, 74 y.o.   MRN: [id number]   Tracye Szuch, is a 74 y.o. female  FTD:[id number]  CWC:[id number]  DOB - Jul 23, 1945  Subjective:  Chief Complaint and HPI: Linda Hughes is a 74 y.o. female here today wanting to discuss a few issues.  Prediabetes and wants to know what she should eat. She has been eating less carbs and exercising more.  She has lost a few pounds.    Also, says BP "running low" at times but can't give me any numbers.  Also c/o urinary urgency and unable to always hold her urine.  Occasional dysuria.    ROS:   Constitutional:  No f/c, No night sweats, No unexplained weight loss. EENT:  No vision changes, No blurry vision, No hearing changes. No mouth, throat, or ear problems.  Respiratory: No cough, No SOB Cardiac: No CP, no palpitations GI:  No abd pain, No N/V/D. GU: see above Musculoskeletal: No joint pain Neuro: No headache, no dizziness, no motor weakness.  Skin: No rash Endocrine:  No polydipsia. No polyuria.  Psych: Denies SI/HI  No problems updated.  ALLERGIES: No Known Allergies  PAST MEDICAL HISTORY: Past Medical History:  Diagnosis Date   Coronary artery disease    GERD (gastroesophageal reflux disease)    High blood cholesterol    Hypertension     MEDICATIONS AT HOME: Prior to Admission medications   Medication Sig Start Date End Date Taking? Authorizing Provider  aspirin EC 81 MG tablet Take 81 mg by mouth daily.   Yes [provider]  citalopram (CELEXA) 40 MG tablet Take 1 tablet (40 mg total) by mouth daily. 08/26/19  Yes Darrian Grzelak, Dionne Bucy, PA-C  hydrOXYzine (ATARAX/VISTARIL) 25 MG tablet TAKE 1 TABLET (25 MG TOTAL) BY MOUTH EVERY 8 (EIGHT) HOURS AS NEEDED. 11/30/18  Yes Charlott Rakes, MD  metoprolol tartrate (LOPRESSOR) 50 MG tablet Take 1 tablet (50 mg total) by mouth 2 (two) times daily. 08/26/19  Yes Argentina Donovan, PA-C  Multiple Vitamin (MULTIVITAMIN WITH MINERALS) TABS tablet  Take 1 tablet by mouth daily. 03/16/13  Yes Tresa Garter, MD  pantoprazole (PROTONIX) 40 MG tablet Take 1 tablet (40 mg total) by mouth daily. 08/26/19  Yes Freeman Caldron M, PA-C  rosuvastatin (CRESTOR) 10 MG tablet Take 1 tablet (10 mg total) by mouth daily. 08/26/19  Yes Freeman Caldron M, PA-C  triamcinolone (KENALOG) 0.025 % cream Apply 1 application topically 2 (two) times daily. 11/16/18  Yes Gildardo Pounds, NP  diphenhydrAMINE (BENADRYL) 25 mg capsule Take 25 mg by mouth every 6 (six) hours as needed.  10/02/14  [provider]  metoprolol succinate (TOPROL-XL) 50 MG 24 hr tablet Take 1 tablet (50 mg total) by mouth daily. Take with or immediately following a meal. 02/27/12 03/17/12  Oswald Hillock, MD  propranolol (INDERAL) 40 MG tablet Take 40 mg by mouth 3 (three) times daily.  01/07/12  [provider]     Objective:  EXAM:   Vitals:   08/26/19 1350  BP: (!) 143/85  Pulse: 61  Temp: 97.7 F (36.5 C)  TempSrc: Temporal  SpO2: 99%  Weight: 110 lb (49.9 kg)  Height: 5\' 1"  (1.549 m)    General appearance : A&OX3. NAD. Non-toxic-appearing HEENT: Atraumatic and Normocephalic.  PERRLA. EOM intact.  TM clear B. Mouth-MMM, post pharynx WNL w/o erythema, No PND. Neck: supple, no JVD. No cervical lymphadenopathy. No thyromegaly Chest/Lungs:  Breathing-non-labored, Good air entry  bilaterally, breath sounds normal without rales, rhonchi, or wheezing  CVS: S1 S2 regular, no murmurs, gallops, rubs  Abdomen: Bowel sounds present, Non tender and not distended with no gaurding, rigidity or rebound. Extremities: Bilateral Lower Ext shows no edema, both legs are warm to touch with = pulse throughout Neurology:  CN II-XII grossly intact, Non focal.   Psych:  TP linear. J/I WNL. Normal speech. Appropriate eye contact and affect.  Skin:  No Rash  Data Review Lab Results  Component Value Date   HGBA1C 5.9 (H) 06/01/2019   HGBA1C 5.60 08/18/2014   HGBA1C 5.9  11/22/2013     Assessment & Plan   1. Prediabetes I have had a lengthy discussion and provided education about insulin resistance and the intake of too much sugar/refined carbohydrates.  I have advised the patient to work at a goal of eliminating sugary drinks, candy, desserts, sweets, refined sugars, processed foods, and white carbohydrates.  The patient expresses understanding.  Discussed diet at length.  See AVS too.   - Glucose (CBG)  2. Urinary urgency No infection-she declines referral today - POCT URINALYSIS DIP (CLINITEK) - metoprolol tartrate (LOPRESSOR) 50 MG tablet; Take 1 tablet (50 mg total) by mouth 2 (two) times daily.  Dispense: 180 tablet; Refill: 3  3. Essential hypertension Not at goal but says "low" at home at times.  I will have her check her BP daily and record and have numbers ready to assess at a tele visit with me or Franky Macho in 2 weeks. - metoprolol tartrate (LOPRESSOR) 50 MG tablet; Take 1 tablet (50 mg total) by mouth 2 (two) times daily.  Dispense: 180 tablet; Refill: 3  4. Mixed hyperlipidemia - rosuvastatin (CRESTOR) 10 MG tablet; Take 1 tablet (10 mg total) by mouth daily.  Dispense: 90 tablet; Refill: 3  5. Gastroesophageal reflux disease - pantoprazole (PROTONIX) 40 MG tablet; Take 1 tablet (40 mg total) by mouth daily.  Dispense: 90 tablet; Refill: 3  6. Anxiety and depression Stable;  No changes - citalopram (CELEXA) 40 MG tablet; Take 1 tablet (40 mg total) by mouth daily.  Dispense: 90 tablet; Refill: 3   Patient have been counseled extensively about nutrition and exercise  Return for 2 weeks for BP tele appt with Franky Macho 3 months with PCP.  The patient was given clear instructions to go to ER or return to medical center if symptoms don't improve, worsen or new problems develop. The patient verbalized understanding. The patient was told to call to get lab results if they haven't heard anything in the next week.     Georgian Co, PA-C [facility] [place], [place] [phone]   08/26/2019, 2:22 PM

## 2019-08-26 NOTE — Patient Instructions (Addendum)
Check blood pressure at least once daily and record and have numbers available for telephone visit in a few weeks.  Drink 80-100 ounces water daily.  Eliminate sugars and carbohydrates from your diet    Hyperglycemia Hyperglycemia is when the sugar (glucose) level in your blood is too high. It may not cause symptoms. If you do have symptoms, they may include warning signs, such as:  Feeling more thirsty than normal.  Hunger.  Feeling tired.  Needing to pee (urinate) more than normal.  Blurry eyesight (vision). You may get other symptoms as it gets worse, such as:  Dry mouth.  Not being hungry (loss of appetite).  Fruity-smelling breath.  Weakness.  Weight gain or loss that is not planned. Weight loss may be fast.  A tingling or numb feeling in your hands or feet.  Headache.  Skin that does not bounce back quickly when it is lightly pinched and released (poor skin turgor).  Pain in your belly (abdomen).  Cuts or bruises that heal slowly. High blood sugar can happen to people who do or do not have diabetes. High blood sugar can happen slowly or quickly, and it can be an emergency. Follow these instructions at home: General instructions  Take over-the-counter and prescription medicines only as told by your doctor.  Do not use products that contain nicotine or tobacco, such as cigarettes and e-cigarettes. If you need help quitting, ask your doctor.  Limit alcohol intake to no more than 1 drink per day for nonpregnant women and 2 drinks per day for men. One drink equals 12 oz of beer, 5 oz of wine, or 1 oz of hard liquor.  Manage stress. If you need help with this, ask your doctor.  Keep all follow-up visits as told by your doctor. This is important. Eating and drinking   Stay at a healthy weight.  Exercise regularly, as told by your doctor.  Drink enough fluid, especially when you: ? Exercise. ? Get sick. ? Are in hot temperatures.  Eat healthy foods,  such as: ? Low-fat (lean) proteins. ? Complex carbs (complex carbohydrates), such as whole wheat bread or brown rice. ? Fresh fruits and vegetables. ? Low-fat dairy products. ? Healthy fats.  Drink enough fluid to keep your pee (urine) clear or pale yellow. If you have diabetes:   Make sure you know the symptoms of hyperglycemia.  Follow your diabetes management plan, as told by your doctor. Make sure you: ? Take insulin and medicines as told. ? Follow your exercise plan. ? Follow your meal plan. Eat on time. Do not skip meals. ? Check your blood sugar as often as told. Make sure to check before and after exercise. If you exercise longer or in a different way than you normally do, check your blood sugar more often. ? Follow your sick day plan whenever you cannot eat or drink normally. Make this plan ahead of time with your doctor.  Share your diabetes management plan with people in your workplace, school, and household.  Check your urine for ketones when you are ill and as told by your doctor.  Carry a card or wear jewelry that says that you have diabetes. Contact a doctor if:  Your blood sugar level is higher than 240 mg/dL (60.4 mmol/L) for 2 days in a row.  You have problems keeping your blood sugar in your target range.  High blood sugar happens often for you. Get help right away if:  You have trouble breathing.  You  have a change in how you think, feel, or act (mental status).  You feel sick to your stomach (nauseous), and that feeling does not go away.  You cannot stop throwing up (vomiting). These symptoms may be an emergency. Do not wait to see if the symptoms will go away. Get medical help right away. Call your local emergency services (911 in the [place] Do not drive yourself to the hospital. Summary  Hyperglycemia is when the sugar (glucose) level in your blood is too high.  High blood sugar can happen to people who do or do not have diabetes.  Make sure you  drink enough fluids, eat healthy foods, and exercise regularly.  Contact your doctor if you have problems keeping your blood sugar in your target range. This information is not intended to replace advice given to you by your health care provider. Make sure you discuss any questions you have with your health care provider. Document Revised: 11/06/2015 Document Reviewed: 11/06/2015 Elsevier Patient Education  [place].

## 2019-09-08 ENCOUNTER — Other Ambulatory Visit: Payer: Self-pay

## 2019-09-08 ENCOUNTER — Ambulatory Visit: Payer: No Typology Code available for payment source | Attending: Family Medicine | Admitting: Pharmacist

## 2019-09-08 ENCOUNTER — Encounter: Payer: Self-pay | Admitting: Pharmacist

## 2019-09-08 VITALS — BP 122/78 | HR 58

## 2019-09-08 DIAGNOSIS — I10 Essential (primary) hypertension: Secondary | ICD-10-CM

## 2019-09-08 NOTE — Progress Notes (Signed)
   S:    PCP: Zelda  Patient arrives in good spirits. Presents to the clinic for hypertension evaluation, counseling, and management. Patient was referred on 08/26/2019 by Marylene Land.  Medication adherence reported.  Current BP Medications include:  Metoprolol tartrate 50 mg BID  Dietary habits include: compliant with salt restriction; denies drinking caffeine  Exercise habits include: exercising daily; reports recent weight loss  Family / Social history:  - FHx: no pertinent positives  - Tobacco: never smoker - Alcohol: denies use   O:  Vitals:   09/08/19 1227  BP: 122/78  Pulse: (!) 58    Home BP readings:  - SBPs: 96 - 129 - DBPs: 58 - 74 - Pulse: 57 - 70   Last 3 Office BP readings: BP Readings from Last 3 Encounters:  09/08/19 122/78  08/26/19 (!) 143/85  04/28/19 (!) 142/90   BMET    Component Value Date/Time   NA 142 06/01/2019 1027   K 4.6 06/01/2019 1027   CL 106 06/01/2019 1027   CO2 26 06/01/2019 1027   GLUCOSE 72 06/01/2019 1027   GLUCOSE 105 (H) 12/28/2015 0949   BUN 18 06/01/2019 1027   CREATININE 1.04 (H) 06/01/2019 1027   CREATININE 0.97 (H) 12/28/2015 0949   CALCIUM 9.9 06/01/2019 1027   GFRNONAA 53 (L) 06/01/2019 1027   GFRNONAA 59 (L) 12/28/2015 0949   GFRAA 61 06/01/2019 1027   GFRAA 68 12/28/2015 0949    Renal function: CrCl cannot be calculated (Patient's most recent lab result is older than the maximum 21 days allowed.).  Clinical ASCVD: No  The 10-year ASCVD risk score Denman George DC Jr., et al., 2013) is: 17%   Values used to calculate the score:     Age: 74 years     Sex: Female     Is Non-Hispanic African American: No     Diabetic: No     Tobacco smoker: No     Systolic Blood Pressure: 122 mmHg     Is BP treated: Yes     HDL Cholesterol: 70 mg/dL     Total Cholesterol: 170 mg/dL  A/P: Hypertension longstanding currently at goal on current medications. BP at home is borderline low with low HR. SBP goal <130 mmHg. Medication  adherence reported. I have asked her to discuss decreasing the dose of her metoprolol at her follow-up appointment with our nurse practitioner. Will continue current regimen for now since today's BP/HR are acceptable.  -Continued current regimen.  -Counseled on lifestyle modifications for blood pressure control including reduced dietary sodium, increased exercise, adequate sleep.  Results reviewed and written information provided.   Total time in face-to-face counseling 15 minutes.   F/U Clinic Visit 09/23/2019.   Butch Penny, PharmD, CPP Clinical Pharmacist [facility] [phone]

## 2019-09-22 MED FILL — ?ROSUVASTATIN CALCIUM 10 MG: 10 | 30 days supply | Qty: 30 | Fill #10

## 2019-09-22 MED FILL — ?PANTOPRAZOLE SODI DR 40MGT: 40 | 30 days supply | Qty: 30 | Fill #10

## 2019-09-22 MED FILL — ?METOPROLOL TART 50 MG TABL: 50 | 30 days supply | Qty: 60 | Fill #10

## 2019-09-22 NOTE — Progress Notes (Signed)
Patient ID: Linda Hughes, female    DOB: 1946/01/22  MRN: [id number]  CC: STOMACH PAIN  Subjective: Linda Hughes is a 74 y.o. female with history of essential hypertension, GERD, dyslipidemia, and generalized anxiety disorder who presents for stomach pain.   1. STOMACH PAIN: Location: right and left upper stomach Onset: lasts a few seconds, does not happen everyday, usually happens when really hungry and/or after eating, worsened about 1 month ago Description: "It feels like when you cut your finger but not as sore."  Modifying factors: eating lemons, lemon juice in water, eating sour things, drinking caffeine, after eating chocolate, reports heartburn/acid reflux after eating these things   Symptoms Nausea/Vomiting: denies Diarrhea: denies Constipation: denies  Melena/BRBPR: little dark sometimes but not often, last colonoscopy 2014 Hematemesis: denies Fever/Chills: denies Jaundice: denies Dysuria: denies Back pain: denies Alcohol use: denies NSAID use: yes, daily Aspirin reports her heart doctor told her to take this  Last visit 08/26/2019 with physician assistant Sharon Seller. During that encounter prescribed Pantoprazole. Today patient endorse she is taking this medication daily without missing doses.  Patient Active Problem List   Diagnosis Date Noted  . Numbness and tingling in left arm 03/27/2015  . Memory loss 03/27/2015  . Essential hypertension 08/18/2014  . Transaminasemia 02/21/2014  . Precordial pain 10/21/2013  . Visit for screening mammogram 07/20/2013  . Dyspepsia 03/16/2013  . Dyslipidemia 09/28/2012  . Generalized anxiety disorder 09/28/2012  . GERD (gastroesophageal reflux disease) 09/28/2012  . DIVERTICULAR DISEASE 10/22/2006  . HYPERCHOLESTEROLEMIA 10/21/2006  . HYPERTENSION 10/21/2006  . HEMORRHOIDS 10/21/2006  . MENOPAUSAL SYNDROME 10/21/2006     Current Outpatient Medications on File Prior to Visit  Medication Sig Dispense Refill  . aspirin EC 81  MG tablet Take 81 mg by mouth daily.    . citalopram (CELEXA) 40 MG tablet Take 1 tablet (40 mg total) by mouth daily. 90 tablet 3  . hydrOXYzine (ATARAX/VISTARIL) 25 MG tablet TAKE 1 TABLET (25 MG TOTAL) BY MOUTH EVERY 8 (EIGHT) HOURS AS NEEDED. 60 tablet 0  . metoprolol tartrate (LOPRESSOR) 50 MG tablet Take 1 tablet (50 mg total) by mouth 2 (two) times daily. 180 tablet 3  . Multiple Vitamin (MULTIVITAMIN WITH MINERALS) TABS tablet Take 1 tablet by mouth daily. 90 tablet 3  . pantoprazole (PROTONIX) 40 MG tablet Take 1 tablet (40 mg total) by mouth daily. 90 tablet 3  . rosuvastatin (CRESTOR) 10 MG tablet Take 1 tablet (10 mg total) by mouth daily. 90 tablet 3  . triamcinolone (KENALOG) 0.025 % cream Apply 1 application topically 2 (two) times daily. 30 g 1  . [DISCONTINUED] diphenhydrAMINE (BENADRYL) 25 mg capsule Take 25 mg by mouth every 6 (six) hours as needed.    . [DISCONTINUED] metoprolol succinate (TOPROL-XL) 50 MG 24 hr tablet Take 1 tablet (50 mg total) by mouth daily. Take with or immediately following a meal. 30 tablet 3  . [DISCONTINUED] propranolol (INDERAL) 40 MG tablet Take 40 mg by mouth 3 (three) times daily.     No current facility-administered medications on file prior to visit.    No Known Allergies  Social History   Socioeconomic History  . Marital status: Married    Spouse name: Not on file  . Number of children: Not on file  . Years of education: Not on file  . Highest education level: Not on file  Occupational History  . Not on file  Tobacco Use  . Smoking status: Never Smoker  . Smokeless  tobacco: Never Used  Vaping Use  . Vaping Use: Never used  Substance and Sexual Activity  . Alcohol use: No  . Drug use: No  . Sexual activity: Not on file  Other Topics Concern  . Not on file  Social History Narrative  . Not on file   Social Determinants of Health   Financial Resource Strain:   . Difficulty of Paying Living Expenses:   Food Insecurity:   .  Worried About Programme researcher, broadcasting/film/video in the Last Year:   . Barista in the Last Year:   Transportation Needs:   . Freight forwarder (Medical):   Marland Kitchen Lack of Transportation (Non-Medical):   Physical Activity:   . Days of Exercise per Week:   . Minutes of Exercise per Session:   Stress:   . Feeling of Stress :   Social Connections:   . Frequency of Communication with Friends and Family:   . Frequency of Social Gatherings with Friends and Family:   . Attends Religious Services:   . Active Member of Clubs or Organizations:   . Attends Banker Meetings:   Marland Kitchen Marital Status:   Intimate Partner Violence:   . Fear of Current or Ex-Partner:   . Emotionally Abused:   Marland Kitchen Physically Abused:   . Sexually Abused:     Family History  Problem Relation Age of Onset  . Cancer Sister     Past Surgical History:  Procedure Laterality Date  . CESAREAN SECTION    . COLONOSCOPY N/A 01/06/2013   Procedure: COLONOSCOPY;  Surgeon: Barrie Folk, MD;  Location: [facility] ENDOSCOPY;  Service: Endoscopy;  Laterality: N/A;  . ESOPHAGOGASTRODUODENOSCOPY N/A 01/06/2013   Procedure: ESOPHAGOGASTRODUODENOSCOPY (EGD);  Surgeon: Barrie Folk, MD;  Location: [facility] ENDOSCOPY;  Service: Endoscopy;  Laterality: N/A;  . GALLBLADDER SURGERY      ROS: Review of Systems Negative except as stated above  PHYSICAL EXAM: Vitals with BMI 09/23/2019 09/23/2019 09/08/2019  Height - - -  Weight - 109 lbs -  BMI - 20.61 -  Systolic 123 149 161  Diastolic 82 79 78  Pulse - 65 58  SpO2- 98%, room air   Wt Readings from Last 3 Encounters:  09/23/19 109 lb (49.4 kg)  08/26/19 110 lb (49.9 kg)  04/28/19 119 lb 6.4 oz (54.2 kg)   Physical Exam General appearance - alert, well appearing, and in no distress, oriented to person, place, and time and normal appearing weight Mental status - alert, oriented to person, place, and time, normal mood, behavior, speech, dress, motor activity, and thought processes Neck -  supple, no significant adenopathy Lymphatics - no palpable lymphadenopathy, no hepatosplenomegaly Chest - clear to auscultation, no wheezes, rales or rhonchi, symmetric air entry, no tachypnea, retractions or cyanosis Heart - normal rate, regular rhythm, normal S1, S2, no murmurs, rubs, clicks or gallops Abdomen - soft, nontender, nondistended, no masses or organomegaly  ASSESSMENT AND PLAN: 1. Gastroesophageal reflux disease, unspecified whether esophagitis present: 2. Epigastric pain: -Patient today with report of epigastric pain which has gotten worse over the course of the last month.  -Reports the pain last for a few seconds and usually occurs when she is hungry and/or right after eating.  -Reports eating/drinking lemons, eating sour things, drinking caffeine, and sometimes chocolate makes it worse. Reports having heartburn and acid reflux when consuming these.  -Endorses taking Pantoprazole 40 mg daily without missing doses. Continue as prescribed. -Patient denies bilateral shoulder/arm/back pain, chest  pain, chest pressure, palpitations, and shortness of breath. -Epigastric pain likely related to symptoms of GERD.  -Last colonoscopy 2014 and not due for repeat until 2024.  -Referral to Gastroenterology for further evaluation and management.  -Follow-up with primary provider as scheduled. - Ambulatory referral to Gastroenterology -You may feel better if you:  ?Lose weight (if you are overweight)  ?Raise the head of your bed by 6 to 8 inches - You can do this by putting blocks of wood or rubber under 2 legs of the bed or a foam wedge under the mattress.  ?Avoid foods that make your symptoms worse - For some people these include coffee, chocolate, alcohol, peppermint, and fatty foods.  ?Stop smoking, if you smoke  ?Avoid late meals - Lying down with a full stomach can make reflux worse. Try to plan meals for at least 2 to 3 hours before bedtime.  ?Avoid tight clothing - Some people  feel better if they wear comfortable clothing that does not squeeze the stomach area.  Patient was given the opportunity to ask questions.  Patient verbalized understanding of the plan and was able to repeat key elements of the plan. Patient was given clear instructions to go to Emergency Department or return to medical center if symptoms don't improve, worsen, or new problems develop.The patient verbalized understanding.  Rema Fendt, NP

## 2019-09-23 ENCOUNTER — Other Ambulatory Visit: Payer: Self-pay

## 2019-09-23 ENCOUNTER — Encounter: Payer: Self-pay | Admitting: Family

## 2019-09-23 ENCOUNTER — Ambulatory Visit: Payer: No Typology Code available for payment source | Attending: Family | Admitting: Family

## 2019-09-23 VITALS — BP 123/82 | HR 65 | Resp 16 | Wt 109.0 lb

## 2019-09-23 DIAGNOSIS — K219 Gastro-esophageal reflux disease without esophagitis: Secondary | ICD-10-CM

## 2019-09-23 DIAGNOSIS — R1013 Epigastric pain: Secondary | ICD-10-CM

## 2019-09-23 MED FILL — ?CITALOPRAM HBR 40 MG TABLE: 40 | 30 days supply | Qty: 30 | Fill #10

## 2019-09-23 NOTE — Patient Instructions (Signed)
Continue Protonix. Referral to Gastroenterology. Follow-up with primary provider as needed. Barrett's Esophagus  Barrett's esophagus occurs when the tissue that lines the esophagus changes or becomes damaged. The esophagus is the tube that carries food from the throat to the stomach. With Barrett's esophagus, the cells that line the esophagus are replaced by cells that are similar to the lining of the intestines (intestinal metaplasia). Barrett's esophagus itself may not cause any symptoms. However, many people who have Barrett's esophagus also have gastroesophageal reflux disease (GERD), which may cause symptoms such as heartburn. Over time, a few people with this condition may develop cancer of the esophagus. Treatment may include medicines, procedures to destroy the abnormal cells, or surgery. What are the causes? The exact cause of this condition is not known. In some cases, the condition develops from damage to the lining of the esophagus caused by GERD. GERD occurs when stomach acids flow up from the stomach into the esophagus. Frequent symptoms of GERD may cause intestinal metaplasia or cause cell changes (dysplasia). What increases the risk? You are more likely to develop this condition if you:  Have GERD.  Are female.  Are Caucasian.  Are obese.  Are older than 50.  Have a hiatal hernia. This is a condition in which part of your stomach bulges into your chest.  Smoke. What are the signs or symptoms? People with Barrett's esophagus often have no symptoms. However, many people with this condition also have GERD. Symptoms of GERD may include:  Heartburn.  Difficulty swallowing.  Dry cough. How is this diagnosed? This condition may be diagnosed based on:  Results of an upper gastrointestinal endoscopy. For this exam, a thin, flexible tube with a light and a camera on the end (endoscope) is passed down your esophagus. Your health care provider can view the inside of your esophagus  during this procedure.  Results of a biopsy. For this procedure, several tissue samples are removed (biopsy) from your esophagus. They are then checked for intestinal metaplasia or dysplasia. How is this treated? Treatment for this condition may include:  Medicines (proton pump inhibitors, or PPIs) to decrease or stop GERD.  Periodic endoscopic exams to make sure that cancer is not developing.  A procedure or surgery for dysplasia. This may include: ? Removal or destruction of abnormal cells. ? Removal of part of the esophagus (esophagectomy). Follow these instructions at home: Eating and drinking  Eat more fruits and vegetables.  Avoid fatty foods.  Eat small, frequent meals instead of large meals.  Avoid foods that cause heartburn. These foods include: ? Coffee and alcoholic drinks. ? Tomatoes and foods made with tomatoes. ? Greasy or spicy foods. ? Chocolate and peppermint.  Do not drink alcohol. General instructions  Take over-the-counter and prescription medicines only as told by your health care provider.  Do not use any products that contain nicotine or tobacco, such as cigarettes and e-cigarettes. If you need help quitting, ask your health care provider.  If you are being treated for GERD, make sure you take medicines and follow all instructions as told by your health care provider.  Keep all follow-up visits as told by your health care provider. This is important. Contact a health care provider if:  You have heartburn or GERD symptoms.  You have difficulty swallowing. Get help right away if:  You have chest pain.  You are unable to swallow.  You vomit blood or material that looks like coffee grounds.  Your stool (feces) is bright red or  dark. Summary  Barrett's esophagus occurs when the tissue that lines the esophagus changes or becomes damaged.  Barrett's esophagus may be diagnosed with an upper gastrointestinal endoscopy and a biopsy.  Treatment  may include medicines, procedures to remove abnormal cells, or surgery.  Follow your health care provider's instructions about what to eat and drink, what medicines to take, and when to call for help. This information is not intended to replace advice given to you by your health care provider. Make sure you discuss any questions you have with your health care provider. Document Revised: 06/16/2017 Document Reviewed: 06/16/2017 Elsevier Patient Education  2020 [employer].

## 2019-09-28 ENCOUNTER — Encounter: Payer: Self-pay | Admitting: Nurse Practitioner

## 2019-10-22 ENCOUNTER — Ambulatory Visit: Payer: No Typology Code available for payment source

## 2019-10-27 MED FILL — ?ROSUVASTATIN CALCIUM 10 MG: 10 | 30 days supply | Qty: 30 | Fill #11

## 2019-10-27 MED FILL — ?METOPROLOL TART 50 MG TABL: 50 | 30 days supply | Qty: 60 | Fill #11

## 2019-10-27 MED FILL — PANTOPRAZOLE SOD DR 40 MG T: 40 | 30 days supply | Qty: 30 | Fill #11

## 2019-10-27 MED FILL — ?CITALOPRAM HBR 40 MG TABLE: 40 | 30 days supply | Qty: 30 | Fill #11

## 2019-11-01 ENCOUNTER — Other Ambulatory Visit: Payer: Self-pay

## 2019-11-01 ENCOUNTER — Ambulatory Visit: Payer: Self-pay

## 2019-11-03 ENCOUNTER — Ambulatory Visit (INDEPENDENT_AMBULATORY_CARE_PROVIDER_SITE_OTHER): Payer: Self-pay | Admitting: Nurse Practitioner

## 2019-11-03 ENCOUNTER — Encounter: Payer: Self-pay | Admitting: Nurse Practitioner

## 2019-11-03 VITALS — BP 142/76 | HR 61 | Ht 61.0 in | Wt 108.0 lb

## 2019-11-03 DIAGNOSIS — K219 Gastro-esophageal reflux disease without esophagitis: Secondary | ICD-10-CM

## 2019-11-03 DIAGNOSIS — R1310 Dysphagia, unspecified: Secondary | ICD-10-CM

## 2019-11-03 DIAGNOSIS — R109 Unspecified abdominal pain: Secondary | ICD-10-CM

## 2019-11-03 NOTE — Patient Instructions (Signed)
You have been scheduled for an endoscopy. Please follow written instructions given to you at your visit today. If you use inhalers (even only as needed), please bring them with you on the day of your procedure.   If you are age 75 or older, your body mass index should be between 23-30. Your Body mass index is 20.41 kg/m. If this is out of the aforementioned range listed, please consider follow up with your Primary Care Provider.  If you are age 81 or younger, your body mass index should be between 19-25. Your Body mass index is 20.41 kg/m. If this is out of the aformentioned range listed, please consider follow up with your Primary Care Provider.    Due to recent changes in healthcare laws, you may see the results of your imaging and laboratory studies on MyChart before your provider has had a chance to review them.  We understand that in some cases there may be results that are confusing or concerning to you. Not all laboratory results come back in the same time frame and the provider may be waiting for multiple results in order to interpret others.  Please give [place] 48 hours in order for your provider to thoroughly review all the results before contacting the office for clarification of your results.   Thank you for choosing me and [facility].  Willette Cluster NP

## 2019-11-03 NOTE — Progress Notes (Signed)
ASSESSMENT / PLAN:   # Intermittent upper abdominal pain, right and left upper quadrants --2-3 months duration.  --Points to two distinct areas of pain in LUQ and RUQ. Pain transient, often worse when hungry and better with food.  --Etiology of pain unclear and unusual. It feels " like a paper cut". It occurs in two different areas of upper abdomen independently of each other and at different times. --Gastritis / reactive gastropathy on EGD for LUQ pain in 2014 by [facility] -- EGD probably low yield but will proceed since the pain is persistent, doesn't seem musculoskeletal, and she is already on a PPI . EGD will also afford opportunity to evaluate dysphagia and GERD symptoms. The risks and benefits of EGD were discussed and the patient agrees to proceed.   # ? GERD --sensation of regurgitation but it doesn't actually occur.  --Patient has been on a PPI, it sounds like for the last 2 years.  Says it was started concurrently with aspirin.  # Chronic, intermittent dysphagia to solids and liquids --Rule out esophageal dysmotility.  Doubt stricture.  Doubt malignancy given chronicity and absence of significant weight loss --Further evaluation at time of EGD  #Colon cancer screening --No polyps or cancers on colonoscopy in 2014 by [facility], 10-year follow-up colonoscopy recommended     HPI:     Chief Complaint: Upper abdominal pain   Linda Hughes is a 74 year female from [place] with past medical / surgical history significant for, not necessarily limited to:  cholecystectomy, depression, GAD, HTN, hyperlipidemia, diverticulosis. She is referred by PCP for evaluation of upper abdominal pain. It is somewhat difficult to communicate due to the language barrier. She is here alone today. She points to RUQ and LUQ as describes the same pain in both areas but not at the same time. The pain , which started two months ago, feels like "when you cut your finger". Pain lasts seconds  then may not reoccur for several days or even for a couple of weeks. The pain can occur when hungry and eating seems to help. She gets nauseated sometimes but that has been a chronic unrelated problem.  She had an upper endoscopy for LUQ pain by Dr. Madilyn Fireman in 2014 findings of mild gastritis.   Patient brings in her medication bottles. She is taking Pantoprazole 40 mg daily. She thinks it was prescribed two years ago when told to go start taking ASA. She does sometimes have a sensation of regurgitation,  though food nor fluid comes up into her mouth. Additionally she has chronic intermittent problems swallowing both solids and liquids, sometimes has to chase foods down with water.   Data Reviewed:  06/01/2019 Creatinine 1.04, CMP otherwise normal CBC normal   PREVIOUS ENDOSCOPIC EVALUATIONS / GI STUDIES:   2014 screening colonoscopy --Diverticulosis --10-year follow-up recommended   2014 EGD for LUQ pain --Mild gastritis  Biopsies Reactive gastropathy.  No H. pylori  Past Medical History:  Diagnosis Date  . Coronary artery disease   . GERD (gastroesophageal reflux disease)   . High blood cholesterol   . Hypertension      Past Surgical History:  Procedure Laterality Date  . CESAREAN SECTION    . COLONOSCOPY N/A 01/06/2013   Procedure: COLONOSCOPY;  Surgeon: Barrie Folk, MD;  Location[facility] ENDOSCOPY;  Service: Endoscopy;  Laterality: N/A;  . ESOPHAGOGASTRODUODENOSCOPY N/A 01/06/2013   Procedure: ESOPHAGOGASTRODUODENOSCOPY (EGD);  Surgeon: Everardo All  Madilyn Fireman, MD;  Location: [facility] ENDOSCOPY;  Service: Endoscopy;  Laterality: N/A;  . GALLBLADDER SURGERY     Family History  Problem Relation Age of Onset  . Breast cancer Sister        older sister   . Diabetes Sister   . Prostate cancer Brother        older brother   . Diabetes Brother   . Colon cancer Neg Hx   . Stomach cancer Neg Hx   . Pancreatic cancer Neg Hx   . Esophageal cancer Neg Hx    Social History   Tobacco Use  .  Smoking status: Never Smoker  . Smokeless tobacco: Never Used  Vaping Use  . Vaping Use: Never used  Substance Use Topics  . Alcohol use: No  . Drug use: No   Current Outpatient Medications  Medication Sig Dispense Refill  . aspirin EC 81 MG tablet Take 81 mg by mouth daily.    . citalopram (CELEXA) 40 MG tablet Take 1 tablet (40 mg total) by mouth daily. 90 tablet 3  . hydrOXYzine (ATARAX/VISTARIL) 25 MG tablet TAKE 1 TABLET (25 MG TOTAL) BY MOUTH EVERY 8 (EIGHT) HOURS AS NEEDED. 60 tablet 0  . metoprolol tartrate (LOPRESSOR) 50 MG tablet Take 1 tablet (50 mg total) by mouth 2 (two) times daily. 180 tablet 3  . Multiple Vitamin (MULTIVITAMIN WITH MINERALS) TABS tablet Take 1 tablet by mouth daily. 90 tablet 3  . pantoprazole (PROTONIX) 40 MG tablet Take 1 tablet (40 mg total) by mouth daily. 90 tablet 3  . rosuvastatin (CRESTOR) 10 MG tablet Take 1 tablet (10 mg total) by mouth daily. 90 tablet 3  . triamcinolone (KENALOG) 0.025 % cream Apply 1 application topically 2 (two) times daily. 30 g 1   No current facility-administered medications for this visit.   No Known Allergies   Review of Systems: Positive for anxiety, back pain, confusion, depression, fatigue, heart rhythm changes, shortness of breath, excessive urination, urine leakage.  All other systems reviewed and negative except where noted in HPI.   Creatinine clearance cannot be calculated (Patient's most recent lab result is older than the maximum 21 days allowed.)   Physical Exam:    Wt Readings from Last 3 Encounters:  11/03/19 108 lb (49 kg)  09/23/19 109 lb (49.4 kg)  08/26/19 110 lb (49.9 kg)    BP (!) 142/76   Pulse 61   Ht 5\' 1"  (1.549 m)   Wt 108 lb (49 kg)   BMI 20.41 kg/m  Constitutional:  Pleasant female in no acute distress. Psychiatric: Normal mood and affect. Behavior is normal. EENT: Pupils normal.  Conjunctivae are normal. No scleral icterus. Neck supple.  Cardiovascular: Normal rate, regular  rhythm. No edema Pulmonary/chest: Effort normal and breath sounds normal. No wheezing, rales or rhonchi. Abdominal: Soft, nondistended, nontender. Bowel sounds active throughout. There are no masses palpable. No hepatomegaly. Neurological: Alert and oriented to person place and time. Skin: Skin is warm and dry. No rashes noted.  , NP  11/03/2019, 3:23 PM  Cc:  Referring Provider 01/03/2020, NP

## 2019-11-04 NOTE — Progress Notes (Signed)
I agree with the above note, plan 

## 2019-11-24 ENCOUNTER — Ambulatory Visit (AMBULATORY_SURGERY_CENTER): Payer: No Typology Code available for payment source | Admitting: Gastroenterology

## 2019-11-24 ENCOUNTER — Other Ambulatory Visit: Payer: Self-pay

## 2019-11-24 ENCOUNTER — Encounter: Payer: Self-pay | Admitting: Gastroenterology

## 2019-11-24 VITALS — BP 113/64 | HR 56 | Temp 97.3°F | Resp 20 | Ht 61.0 in | Wt 108.0 lb

## 2019-11-24 DIAGNOSIS — R109 Unspecified abdominal pain: Secondary | ICD-10-CM

## 2019-11-24 DIAGNOSIS — K2950 Unspecified chronic gastritis without bleeding: Secondary | ICD-10-CM

## 2019-11-24 DIAGNOSIS — K319 Disease of stomach and duodenum, unspecified: Secondary | ICD-10-CM

## 2019-11-24 DIAGNOSIS — K2970 Gastritis, unspecified, without bleeding: Secondary | ICD-10-CM

## 2019-11-24 MED ORDER — SODIUM CHLORIDE 0.9 % IV SOLN: 500.0000 mL | Freq: Once | INTRAVENOUS | Status: DC

## 2019-11-24 MED FILL — ?METOPROLOL TART 50 MG TABL: 50 | 30 days supply | Qty: 60 | Fill #0

## 2019-11-24 MED FILL — ?ROSUVASTATIN CALCIUM 10 MG: 10 | 30 days supply | Qty: 30 | Fill #0

## 2019-11-24 MED FILL — ?CITALOPRAM HBR 40 MG TABLE: 40 | 30 days supply | Qty: 30 | Fill #0

## 2019-11-24 MED FILL — PANTOPRAZOLE SOD DR 40 MG T: 40 | 30 days supply | Qty: 30 | Fill #0

## 2019-11-24 NOTE — Op Note (Signed)
[facility] Patient Name: Linda Hughes Procedure Date: 11/24/2019 9:59 AM MRN: [id number] Endoscopist: Rachael Fee , MD Age: 74 Referring MD:  Date of Birth: June 15, 1945 Gender: Female Account #: [id number] Procedure:                Upper GI endoscopy Indications:              Abdominal pain in the right upper quadrant,                            Abdominal pain in the left upper quadrant Medicines:                Monitored Anesthesia Care Procedure:                Pre-Anesthesia Assessment:                           - Prior to the procedure, a History and Physical                            was performed, and patient medications and                            allergies were reviewed. The patient's tolerance of                            previous anesthesia was also reviewed. The risks                            and benefits of the procedure and the sedation                            options and risks were discussed with the patient.                            All questions were answered, and informed consent                            was obtained. Prior Anticoagulants: The patient has                            taken no previous anticoagulant or antiplatelet                            agents. ASA Grade Assessment: II - A patient with                            mild systemic disease. After reviewing the risks                            and benefits, the patient was deemed in                            satisfactory condition to undergo the procedure.  After obtaining informed consent, the endoscope was                            passed under direct vision. Throughout the                            procedure, the patient's blood pressure, pulse, and                            oxygen saturations were monitored continuously. The                            Endoscope was introduced through the mouth, and                            advanced to the second  part of duodenum. The upper                            GI endoscopy was accomplished without difficulty.                            The patient tolerated the procedure well. Scope In: Scope Out: Findings:                 Moderate inflammation characterized by erosions,                            erythema, friability and granularity was found in                            the gastric antrum and in the prepyloric region of                            the stomach. Biopsies were taken with a cold                            forceps for histology.                           The exam was otherwise without abnormality. Complications:            No immediate complications. Estimated blood loss:                            None. Estimated Blood Loss:     Estimated blood loss: none. Impression:               - Gastritis. Biopsied.                           - The examination was otherwise normal. Recommendation:           - Patient has a contact number available for                            emergencies. The signs and symptoms of potential  delayed complications were discussed with the                            patient. Return to normal activities tomorrow.                            Written discharge instructions were provided to the                            patient.                           - Resume previous diet.                           - Continue present medications.                           - Await pathology results. If the biopsies do NOT                            show H. pylori then will increase PPI coverage and                            arrange imaging test for further workup. Rachael Fee, MD 11/24/2019 10:15:25 AM This report has been signed electronically.

## 2019-11-24 NOTE — Progress Notes (Signed)
Called to room to assist during endoscopic procedure.  Patient ID and intended procedure confirmed with present staff. Received instructions for my participation in the procedure from the performing physician.  

## 2019-11-24 NOTE — Patient Instructions (Signed)
Handout provided on gastritis.   YOU HAD AN ENDOSCOPIC PROCEDURE TODAY AT THE [facility]:   Refer to the procedure report that was given to you for any specific questions about what was found during the examination.  If the procedure report does not answer your questions, please call your gastroenterologist to clarify.  If you requested that your care partner not be given the details of your procedure findings, then the procedure report has been included in a sealed envelope for you to review at your convenience later.  YOU SHOULD EXPECT: Some feelings of bloating in the abdomen. Passage of more gas than usual.  Walking can help get rid of the air that was put into your GI tract during the procedure and reduce the bloating. If you had a lower endoscopy (such as a colonoscopy or flexible sigmoidoscopy) you may notice spotting of blood in your stool or on the toilet paper. If you underwent a bowel prep for your procedure, you may not have a normal bowel movement for a few days.  Please Note:  You might notice some irritation and congestion in your nose or some drainage.  This is from the oxygen used during your procedure.  There is no need for concern and it should clear up in a day or so.  SYMPTOMS TO REPORT IMMEDIATELY:  Following upper endoscopy (EGD)  Vomiting of blood or coffee ground material  New chest pain or pain under the shoulder blades  Painful or persistently difficult swallowing  New shortness of breath  Fever of 100F or higher  Black, tarry-looking stools  For urgent or emergent issues, a gastroenterologist can be reached at any hour by calling [phone]. Do not use MyChart messaging for urgent concerns.    DIET:  We do recommend a small meal at first, but then you may proceed to your regular diet.  Drink plenty of fluids but you should avoid alcoholic beverages for 24 hours.  ACTIVITY:  You should plan to take it easy for the rest of today and you should NOT  DRIVE or use heavy machinery until tomorrow (because of the sedation medicines used during the test).    FOLLOW UP: Our staff will call the number listed on your records 48-72 hours following your procedure to check on you and address any questions or concerns that you may have regarding the information given to you following your procedure. If we do not reach you, we will leave a message.  We will attempt to reach you two times.  During this call, we will ask if you have developed any symptoms of COVID 19. If you develop any symptoms (ie: fever, flu-like symptoms, shortness of breath, cough etc.) before then, please call [phone].  If you test positive for Covid 19 in the 2 weeks post procedure, please call and report this information to us.    If any biopsies were taken you will be contacted by phone or by letter within the next 1-3 weeks.  Please call us at [phone] if you have not heard about the biopsies in 3 weeks.    SIGNATURES/CONFIDENTIALITY: You and/or your care partner have signed paperwork which will be entered into your electronic medical record.  These signatures attest to the fact that that the information above on your After Visit Summary has been reviewed and is understood.  Full responsibility of the confidentiality of this discharge information lies with you and/or your care-partner.  

## 2019-11-24 NOTE — Progress Notes (Signed)
Pt's states no medical or surgical changes since previsit or office visit. 

## 2019-11-24 NOTE — Progress Notes (Signed)
PT taken to PACU. Monitors in place. VSS. Report given to RN. 

## 2019-11-26 ENCOUNTER — Ambulatory Visit: Payer: No Typology Code available for payment source | Attending: Nurse Practitioner | Admitting: Nurse Practitioner

## 2019-11-26 ENCOUNTER — Telehealth: Payer: Self-pay | Admitting: *Deleted

## 2019-11-26 ENCOUNTER — Other Ambulatory Visit: Payer: Self-pay

## 2019-11-26 ENCOUNTER — Encounter: Payer: Self-pay | Admitting: Nurse Practitioner

## 2019-11-26 VITALS — BP 123/70 | HR 58 | Temp 97.7°F | Ht 61.0 in | Wt 107.0 lb

## 2019-11-26 DIAGNOSIS — Z1231 Encounter for screening mammogram for malignant neoplasm of breast: Secondary | ICD-10-CM

## 2019-11-26 DIAGNOSIS — F419 Anxiety disorder, unspecified: Secondary | ICD-10-CM

## 2019-11-26 DIAGNOSIS — I10 Essential (primary) hypertension: Secondary | ICD-10-CM

## 2019-11-26 DIAGNOSIS — K089 Disorder of teeth and supporting structures, unspecified: Secondary | ICD-10-CM

## 2019-11-26 DIAGNOSIS — R350 Frequency of micturition: Secondary | ICD-10-CM

## 2019-11-26 DIAGNOSIS — R7303 Prediabetes: Secondary | ICD-10-CM

## 2019-11-26 DIAGNOSIS — L819 Disorder of pigmentation, unspecified: Secondary | ICD-10-CM

## 2019-11-26 DIAGNOSIS — F329 Major depressive disorder, single episode, unspecified: Secondary | ICD-10-CM

## 2019-11-26 LAB — POCT GLYCOSYLATED HEMOGLOBIN (HGB A1C): Hemoglobin A1C: 5.6 % (ref 4.0–5.6)

## 2019-11-26 LAB — GLUCOSE, POCT (MANUAL RESULT ENTRY): POC Glucose: 148 mg/dl — AB (ref 70–99)

## 2019-11-26 MED ORDER — HYDROXYZINE HCL 25 MG PO TABS: 25.0000 mg | ORAL_TABLET | Freq: Three times a day (TID) | ORAL | 0 refills | Status: DC | PRN

## 2019-11-26 MED ORDER — METOPROLOL TARTRATE 50 MG PO TABS: 50.0000 mg | ORAL_TABLET | Freq: Two times a day (BID) | ORAL | 3 refills | Status: DC

## 2019-11-26 MED FILL — hydrOXYzine HCL 25 MG TABS: 25 | 20 days supply | Qty: 60 | Fill #0

## 2019-11-26 NOTE — Progress Notes (Signed)
Assessment & Plan:  Linda Hughes was seen today for follow-up.  Diagnoses and all orders for this visit:  Prediabetes -     Glucose (CBG) -     HgB A1c -     CMP14+EGFR Continue blood sugar control as discussed in office today, low carbohydrate diet, and regular physical exercise as tolerated, 150 minutes per week (30 min each day, 5 days per week, or 50 min 3 days per week).    Essential hypertension -     metoprolol tartrate (LOPRESSOR) 50 MG tablet; Take 1 tablet (50 mg total) by mouth 2 (two) times daily. Continue all antihypertensives as prescribed.  Remember to bring in your blood pressure log with you for your follow up appointment.  DASH/Mediterranean Diets are healthier choices for HTN.   Anxiety and depression -     hydrOXYzine (ATARAX/VISTARIL) 25 MG tablet; Take 1 tablet (25 mg total) by mouth every 8 (eight) hours as needed.  Hyperpigmented skin lesion -     Ambulatory referral to Dermatology  Breast cancer screening by mammogram -     MM 3D SCREEN BREAST BILATERAL; Future  Poor dentition -     Ambulatory referral to Dentistry  Increased urinary frequency -     POCT URINALYSIS DIP (CLINITEK); Future    Patient has been counseled on age-appropriate routine health concerns for screening and prevention. These are reviewed and up-to-date. Referrals have been placed accordingly. Immunizations are up-to-date or declined.    Subjective:   Chief Complaint  Patient presents with  . Follow-up    Pt. is here for a follow up on hypertension and prediabetes.    HPI [name] 74 y.o. female presents to office today for follow up.  has a past medical history of Coronary artery disease, GERD (gastroesophageal reflux disease), High blood cholesterol, and Hypertension.  Prediabetes Doing well. I have recommended that she does not have to monitor her blood glucose levels every day. She can however check a reading when she feels she may have eaten something that could cause  her blood glucose to spike. She is concerned about her prediabetes and has placed herself on a strict diet. We had a lengthy conversation regarding dietary modifications vs restrictions.  Lab Results  Component Value Date   HGBA1C 5.6 11/26/2019   Anxiety and Depression Had been feeling more depressed due to dietary restrictions however after talking today she states she feels better about not having to avoid certain foods that she likes.   GU Symptoms  Unable to control urine. Associated symptoms: Dribbling urine and inability to hold urine after a strong sense of urge to urinate. Does have to wear protective undergarments at times. Onset several months ago.    Essential Hypertension Blood pressure is well controlled. Taking metoprolol 50 mg BID. She does endorses intermittent dizziness from time to time. Drinking about 16oz of water daily which I have encouraged her to increase to at least 48oz daily. Denies chest pain, shortness of breath, palpitations, headaches or BLE edema.  BP Readings from Last 3 Encounters:  11/26/19 123/70  11/24/19 113/64  11/03/19 (!) 142/76    Review of Systems  Constitutional: Negative for fever, malaise/fatigue and weight loss.  HENT: Negative.  Negative for nosebleeds.   Eyes: Negative.  Negative for blurred vision, double vision and photophobia.  Respiratory: Negative.  Negative for cough and shortness of breath.   Cardiovascular: Negative.  Negative for chest pain, palpitations and leg swelling.  Gastrointestinal: Negative.  Negative  for heartburn, nausea and vomiting.  Genitourinary: Positive for urgency.       SEE HPI  Musculoskeletal: Negative.  Negative for myalgias.  Neurological: Positive for dizziness. Negative for focal weakness, seizures and headaches.  Psychiatric/Behavioral: Negative for suicidal ideas. The patient is nervous/anxious.     Past Medical History:  Diagnosis Date  . Coronary artery disease   . GERD (gastroesophageal  reflux disease)   . High blood cholesterol   . Hypertension     Past Surgical History:  Procedure Laterality Date  . CESAREAN SECTION    . COLONOSCOPY N/A 01/06/2013   Procedure: COLONOSCOPY;  Surgeon: Missy Sabins, MD;  Location: [facility];  Service: Endoscopy;  Laterality: N/A;  . ESOPHAGOGASTRODUODENOSCOPY N/A 01/06/2013   Procedure: ESOPHAGOGASTRODUODENOSCOPY (EGD);  Surgeon: Missy Sabins, MD;  Location: [facility];  Service: Endoscopy;  Laterality: N/A;  . GALLBLADDER SURGERY      Family History  Problem Relation Age of Onset  . Breast cancer Sister        older sister   . Diabetes Sister   . Prostate cancer Brother        older brother   . Diabetes Brother   . Colon cancer Neg Hx   . Stomach cancer Neg Hx   . Pancreatic cancer Neg Hx   . Esophageal cancer Neg Hx     Social History Reviewed with no changes to be made today.   Outpatient Medications Prior to Visit  Medication Sig Dispense Refill  . citalopram (CELEXA) 40 MG tablet Take 1 tablet (40 mg total) by mouth daily. 90 tablet 3  . Multiple Vitamin (MULTIVITAMIN WITH MINERALS) TABS tablet Take 1 tablet by mouth daily. 90 tablet 3  . pantoprazole (PROTONIX) 40 MG tablet Take 1 tablet (40 mg total) by mouth daily. 90 tablet 3  . rosuvastatin (CRESTOR) 10 MG tablet Take 1 tablet (10 mg total) by mouth daily. 90 tablet 3  . triamcinolone (KENALOG) 0.025 % cream Apply 1 application topically 2 (two) times daily. 30 g 1  . aspirin EC 81 MG tablet Take 81 mg by mouth daily.    . hydrOXYzine (ATARAX/VISTARIL) 25 MG tablet TAKE 1 TABLET (25 MG TOTAL) BY MOUTH EVERY 8 (EIGHT) HOURS AS NEEDED. 60 tablet 0  . metoprolol tartrate (LOPRESSOR) 50 MG tablet Take 1 tablet (50 mg total) by mouth 2 (two) times daily. 180 tablet 3   No facility-administered medications prior to visit.    No Known Allergies     Objective:    BP 123/70 (BP Location: Left Arm, Patient Position: Sitting, Cuff Size: Normal)   Pulse (!) 58    Temp 97.7 F (36.5 C) (Temporal)   Ht $R'5\' 1"'CA$  (1.549 m)   Wt 107 lb (48.5 kg)   SpO2 98%   BMI 20.22 kg/m  Wt Readings from Last 3 Encounters:  11/26/19 107 lb (48.5 kg)  11/24/19 108 lb (49 kg)  11/03/19 108 lb (49 kg)    Physical Exam Vitals and nursing note reviewed.  Constitutional:      Appearance: She is well-developed.  HENT:     Head: Normocephalic and atraumatic.     Mouth/Throat:     Dentition: Abnormal dentition. Dental caries present.  Cardiovascular:     Rate and Rhythm: Normal rate and regular rhythm.     Heart sounds: Normal heart sounds. No murmur heard.  No friction rub. No gallop.   Pulmonary:     Effort: Pulmonary effort is normal. No  tachypnea or respiratory distress.     Breath sounds: Normal breath sounds. No decreased breath sounds, wheezing, rhonchi or rales.  Chest:     Chest wall: No tenderness.  Abdominal:     General: Bowel sounds are normal.     Palpations: Abdomen is soft.  Musculoskeletal:        General: Normal range of motion.     Cervical back: Normal range of motion.  Skin:    General: Skin is warm and dry.     Findings: Lesion (macular hyperpigmented skin lesion approximately 0.5cm in circumference with rough surface. Slightly adjacent to left outer canthus ) present.  Neurological:     Mental Status: She is alert and oriented to person, place, and time.     Coordination: Coordination normal.  Psychiatric:        Behavior: Behavior normal. Behavior is cooperative.        Thought Content: Thought content normal.        Judgment: Judgment normal.          Patient has been counseled extensively about nutrition and exercise as well as the importance of adherence with medications and regular follow-up. The patient was given clear instructions to go to ER or return to medical center if symptoms don't improve, worsen or new problems develop. The patient verbalized understanding.   Follow-up: Return in about 3 months (around 02/25/2020).    Gildardo Pounds, FNP-BC [facility] [place], [place]   11/26/2019, 1:59 PM

## 2019-11-26 NOTE — Telephone Encounter (Signed)
The pt has been advised and verbalized understanding of the instructions.  She will take ASA at bedtime and add pepcid at bedtime.  Her PPI she will take in the morning

## 2019-11-26 NOTE — Telephone Encounter (Signed)
1. Have you developed a fever since your procedure? no  2.   Have you had an respiratory symptoms (SOB or cough) since your procedure? no  3.   Have you tested positive for COVID 19 since your procedure no  4.   Have you had any family members/close contacts diagnosed with the COVID 19 since your procedure?  no   If yes to any of these questions please route to Laverna Peace, RN and Karlton Lemon, RN Follow up Call-  Call back number 11/24/2019  Post procedure Call Back phone  # [phone]  Permission to leave phone message No  Some recent data might be hidden     Patient questions:  Do you have a fever, pain , or abdominal swelling? No. Pain Score  0 *  Have you tolerated food without any problems? Yes.    Have you been able to return to your normal activities? Yes.    Do you have any questions about your discharge instructions: Diet   No. Medications  Yes.   Follow up visit  No.  Do you have questions or concerns about your Care? No.  Actions: * If pain score is 4 or above: No action needed, pain <4. Pt says after she takes her reflux medication ,Pantoprazole and Aspirin in the morning that she feels like her stomach feels worse . Told pt a message would be sent to Dr Christella Hartigan to ask about taking the Aspirin with her Pantoprazole since she has gastric erosions . Pt says she is on the Aspirin for her cholesterol .

## 2019-11-26 NOTE — Telephone Encounter (Signed)
Thanks  Patty, Can you contact her.  Please asked that she take her proton pump inhibitor 20 to 30 minutes prior to breakfast.  I prefer she take her aspirin at bedtime instead of in the morning.  I would also like her to take a famotidine, Pepcid 20 mg at bedtime every night as well.  Thanks

## 2019-11-27 LAB — CMP14+EGFR
ALT: 63 IU/L — ABNORMAL HIGH (ref 0–32)
AST: 52 IU/L — ABNORMAL HIGH (ref 0–40)
Albumin/Globulin Ratio: 1.8 (ref 1.2–2.2)
Albumin: 4.4 g/dL (ref 3.7–4.7)
Alkaline Phosphatase: 91 IU/L (ref 44–121)
BUN/Creatinine Ratio: 14 (ref 12–28)
BUN: 13 mg/dL (ref 8–27)
Bilirubin Total: 0.3 mg/dL (ref 0.0–1.2)
CO2: 31 mmol/L — ABNORMAL HIGH (ref 20–29)
Calcium: 9.3 mg/dL (ref 8.7–10.3)
Chloride: 102 mmol/L (ref 96–106)
Creatinine, Ser: 0.92 mg/dL (ref 0.57–1.00)
GFR calc Af Amer: 71 mL/min/{1.73_m2} (ref >59)
GFR calc non Af Amer: 62 mL/min/{1.73_m2} (ref >59)
Globulin, Total: 2.5 g/dL (ref 1.5–4.5)
Glucose: 98 mg/dL (ref 65–99)
Potassium: 4.1 mmol/L (ref 3.5–5.2)
Sodium: 143 mmol/L (ref 134–144)
Total Protein: 6.9 g/dL (ref 6.0–8.5)

## 2019-12-01 ENCOUNTER — Other Ambulatory Visit: Payer: Self-pay | Admitting: Gastroenterology

## 2019-12-01 ENCOUNTER — Telehealth: Payer: Self-pay | Admitting: Gastroenterology

## 2019-12-01 DIAGNOSIS — K219 Gastro-esophageal reflux disease without esophagitis: Secondary | ICD-10-CM

## 2019-12-01 MED ORDER — PANTOPRAZOLE SODIUM 40 MG PO TBEC: 40.0000 mg | DELAYED_RELEASE_TABLET | Freq: Two times a day (BID) | ORAL | 3 refills | Status: DC

## 2019-12-01 NOTE — Telephone Encounter (Signed)
Spoke with Camielle at [facility] in regards to prior auth for pantoprazole 40mg  BID.  Camielle states she does not require PA as she is uninsured.  She asked that new Rx for increased dose be sent to pharmacy so that patient could pick up next week.  Patient made aware of Camielle's recommendations.  Patient states that she does not drive and prefers that medications be mailed to home.  Phone call to Camielle was made asking that pantoprazole 40mg  BID be mailed to home once received. Rx sent with instructions to mail to home as well.

## 2019-12-03 MED FILL — PANTOPRAZOLE SOD DR 40 MG T: 40 | 30 days supply | Qty: 60 | Fill #0

## 2019-12-10 ENCOUNTER — Telehealth: Payer: Self-pay | Admitting: Nurse Practitioner

## 2019-12-10 ENCOUNTER — Other Ambulatory Visit: Payer: Self-pay | Admitting: Nurse Practitioner

## 2019-12-10 DIAGNOSIS — Z1231 Encounter for screening mammogram for malignant neoplasm of breast: Secondary | ICD-10-CM

## 2019-12-10 NOTE — Telephone Encounter (Signed)
Dermatology was sent on 10/1  through the [facility] card  they will forwarder to [facility] for review   9/24 Dental  Referral  Urgent to  [facility]

## 2019-12-10 NOTE — Telephone Encounter (Signed)
Copied from CRM [id number]. Topic: General - Other >> Dec 10, 2019  3:09 PM Jaquita Rector A wrote: Reason for CRM: Patient called to inform Bertram Denver that she is still waiting to hear from Dermatology and the Dentisit about the referral that was made . Please advise Ph# [phone]

## 2019-12-21 ENCOUNTER — Ambulatory Visit: Payer: No Typology Code available for payment source | Attending: Nurse Practitioner | Admitting: Pharmacist

## 2019-12-21 ENCOUNTER — Other Ambulatory Visit: Payer: Self-pay

## 2019-12-21 DIAGNOSIS — Z23 Encounter for immunization: Secondary | ICD-10-CM

## 2019-12-21 NOTE — Progress Notes (Signed)
Patient presents for vaccination against strep pneumo and influenza per orders of Zelda. Consent given. Counseling provided. No contraindications exists. Vaccine administered without incident.   Butch Penny, PharmD, CPP Clinical Pharmacist [facility] [phone]

## 2019-12-22 ENCOUNTER — Ambulatory Visit: Payer: No Typology Code available for payment source

## 2019-12-23 ENCOUNTER — Ambulatory Visit
Admission: RE | Admit: 2019-12-23 | Discharge: 2019-12-23 | Disposition: A | Discharge status: Home / Self Care | Payer: No Typology Code available for payment source | Source: Ambulatory Visit | Attending: Nurse Practitioner | Admitting: Nurse Practitioner

## 2019-12-23 ENCOUNTER — Other Ambulatory Visit: Payer: Self-pay

## 2019-12-23 DIAGNOSIS — Z1231 Encounter for screening mammogram for malignant neoplasm of breast: Secondary | ICD-10-CM

## 2019-12-31 ENCOUNTER — Telehealth: Payer: Self-pay | Admitting: Nurse Practitioner

## 2019-12-31 DIAGNOSIS — I10 Essential (primary) hypertension: Secondary | ICD-10-CM

## 2019-12-31 NOTE — Telephone Encounter (Signed)
Copied from CRM [id number]. Topic: Appointment Scheduling - Scheduling Inquiry for Clinic >> Dec 31, 2019  9:14 AM Leafy Ro wrote: Reason for CRM: Pt saw Linda Hughes on sept 24 and need to have repeat blood work in 6 wk. There is no order for blood work

## 2019-12-31 NOTE — Telephone Encounter (Signed)
Pt called back and aware orders for labs are in.  Pt has scheduled lab appt 11/3.

## 2019-12-31 NOTE — Telephone Encounter (Signed)
Pt. Can come in for lab work. Orders will be place in as future lab work.  Attempted to call patient. No answer.

## 2020-01-03 MED FILL — ?CITALOPRAM HBR 40 MG TABLE: 40 | 30 days supply | Qty: 30 | Fill #1

## 2020-01-03 MED FILL — PANTOPRAZOLE SOD DR 40 MG T: 40 | 30 days supply | Qty: 60 | Fill #1

## 2020-01-03 MED FILL — ?METOPROLOL TART 50 MG TABL: 50 | 30 days supply | Qty: 60 | Fill #1

## 2020-01-03 MED FILL — ?ROSUVASTATIN CALCIUM 10 MG: 10 | 30 days supply | Qty: 30 | Fill #1

## 2020-01-05 ENCOUNTER — Ambulatory Visit: Payer: No Typology Code available for payment source | Attending: Nurse Practitioner

## 2020-01-05 ENCOUNTER — Other Ambulatory Visit: Payer: Self-pay

## 2020-01-05 DIAGNOSIS — I10 Essential (primary) hypertension: Secondary | ICD-10-CM

## 2020-01-06 LAB — CMP14+EGFR
ALT: 37 IU/L — ABNORMAL HIGH (ref 0–32)
AST: 44 IU/L — ABNORMAL HIGH (ref 0–40)
Albumin/Globulin Ratio: 1.5 (ref 1.2–2.2)
Albumin: 4.4 g/dL (ref 3.7–4.7)
Alkaline Phosphatase: 85 IU/L (ref 44–121)
BUN/Creatinine Ratio: 18 (ref 12–28)
BUN: 19 mg/dL (ref 8–27)
Bilirubin Total: 0.5 mg/dL (ref 0.0–1.2)
CO2: 28 mmol/L (ref 20–29)
Calcium: 9.6 mg/dL (ref 8.7–10.3)
Chloride: 99 mmol/L (ref 96–106)
Creatinine, Ser: 1.08 mg/dL — ABNORMAL HIGH (ref 0.57–1.00)
GFR calc Af Amer: 58 mL/min/{1.73_m2} — ABNORMAL LOW (ref >59)
GFR calc non Af Amer: 50 mL/min/{1.73_m2} — ABNORMAL LOW (ref >59)
Globulin, Total: 2.9 g/dL (ref 1.5–4.5)
Glucose: 89 mg/dL (ref 65–99)
Potassium: 4.2 mmol/L (ref 3.5–5.2)
Sodium: 140 mmol/L (ref 134–144)
Total Protein: 7.3 g/dL (ref 6.0–8.5)

## 2020-01-24 MED FILL — ?METOPROLOL TART 50 MG TABL: 50 | 30 days supply | Qty: 60 | Fill #2

## 2020-01-24 MED FILL — ?ROSUVASTATIN CALCIUM 10 MG: 10 | 30 days supply | Qty: 30 | Fill #2

## 2020-01-24 MED FILL — PANTOPRAZOLE SOD DR 40 MG T: 40 | 30 days supply | Qty: 60 | Fill #2

## 2020-02-22 MED FILL — ?METOPROLOL TART 50 MG TABL: 50 | 30 days supply | Qty: 60 | Fill #3

## 2020-02-22 MED FILL — ?ROSUVASTATIN CALCIUM 10 MG: 10 | 30 days supply | Qty: 30 | Fill #3

## 2020-02-22 MED FILL — PANTOPRAZOLE SOD DR 40 MG T: 40 | 30 days supply | Qty: 60 | Fill #3

## 2020-02-22 MED FILL — ?CITALOPRAM HBR 40 MG TABLE: 40 | 30 days supply | Qty: 30 | Fill #2

## 2020-02-29 ENCOUNTER — Ambulatory Visit: Payer: No Typology Code available for payment source | Admitting: Nurse Practitioner

## 2020-03-22 ENCOUNTER — Other Ambulatory Visit: Payer: Self-pay

## 2020-03-22 ENCOUNTER — Other Ambulatory Visit: Payer: Self-pay | Admitting: Nurse Practitioner

## 2020-03-22 ENCOUNTER — Ambulatory Visit: Payer: No Typology Code available for payment source | Attending: Nurse Practitioner | Admitting: Nurse Practitioner

## 2020-03-22 ENCOUNTER — Encounter: Payer: Self-pay | Admitting: Nurse Practitioner

## 2020-03-22 VITALS — BP 112/67 | HR 61 | Temp 98.6°F | Ht 61.0 in | Wt 109.0 lb

## 2020-03-22 DIAGNOSIS — M25511 Pain in right shoulder: Secondary | ICD-10-CM

## 2020-03-22 DIAGNOSIS — F419 Anxiety disorder, unspecified: Secondary | ICD-10-CM

## 2020-03-22 DIAGNOSIS — N393 Stress incontinence (female) (male): Secondary | ICD-10-CM

## 2020-03-22 DIAGNOSIS — R7989 Other specified abnormal findings of blood chemistry: Secondary | ICD-10-CM

## 2020-03-22 DIAGNOSIS — Z0000 Encounter for general adult medical examination without abnormal findings: Secondary | ICD-10-CM

## 2020-03-22 DIAGNOSIS — R7303 Prediabetes: Secondary | ICD-10-CM

## 2020-03-22 DIAGNOSIS — G8929 Other chronic pain: Secondary | ICD-10-CM

## 2020-03-22 DIAGNOSIS — F32A Depression, unspecified: Secondary | ICD-10-CM

## 2020-03-22 LAB — POCT URINALYSIS DIP (CLINITEK)
Bilirubin, UA: NEGATIVE
Glucose, UA: NEGATIVE mg/dL
Ketones, POC UA: NEGATIVE mg/dL
Leukocytes, UA: NEGATIVE
Nitrite, UA: NEGATIVE
POC PROTEIN,UA: NEGATIVE
Spec Grav, UA: 1.015 (ref 1.010–1.025)
Urobilinogen, UA: 0.2 E.U./dL
pH, UA: 6 (ref 5.0–8.0)

## 2020-03-22 LAB — GLUCOSE, POCT (MANUAL RESULT ENTRY): POC Glucose: 148 mg/dl — AB (ref 70–99)

## 2020-03-22 MED ORDER — SULFAMETHOXAZOLE-TRIMETHOPRIM 800-160 MG PO TABS: 1.0000 | ORAL_TABLET | Freq: Two times a day (BID) | ORAL | 0 refills | Status: AC

## 2020-03-22 MED ORDER — HYDROXYZINE HCL 25 MG PO TABS: 25.0000 mg | ORAL_TABLET | Freq: Three times a day (TID) | ORAL | 0 refills | Status: DC | PRN

## 2020-03-22 MED ORDER — DICLOFENAC SODIUM 1 % EX GEL: 2.0000 g | Freq: Four times a day (QID) | CUTANEOUS | 1 refills | Status: DC

## 2020-03-22 MED FILL — PANTOPRAZOLE SOD DR 40 MG T: 40 | 30 days supply | Qty: 60 | Fill #4

## 2020-03-22 MED FILL — ?BACTRIM DS 800-160 MG TABS: 3 days supply | Qty: 6 | Fill #0

## 2020-03-22 MED FILL — ?ROSUVASTATIN CALCIUM 10 MG: 10 | 30 days supply | Qty: 30 | Fill #4

## 2020-03-22 MED FILL — DICLOFENAC SODIUM 1% GEL: 1 | 12 days supply | Qty: 100 | Fill #0

## 2020-03-22 MED FILL — hydrOXYzine HCL 25 MG TABS: 25 | 20 days supply | Qty: 60 | Fill #0

## 2020-03-22 MED FILL — ?METOPROLOL TART 50 MG TABL: 50 | 30 days supply | Qty: 60 | Fill #4

## 2020-03-22 MED FILL — ?CITALOPRAM HBR 40 MG TABLE: 40 | 30 days supply | Qty: 30 | Fill #3

## 2020-03-22 NOTE — Progress Notes (Signed)
Assessment & Plan:  Linda Hughes was seen today for follow-up.  Diagnoses and all orders for this visit:  Encounter for annual physical exam -     POCT URINALYSIS DIP (CLINITEK)  Prediabetes -     Glucose (CBG) Continue blood sugar control as discussed in office today, low carbohydrate diet, and regular physical exercise as tolerated, 150 minutes per week (30 min each day, 5 days per week, or 50 min 3 days per week).  Chronic right shoulder pain -     diclofenac Sodium (VOLTAREN) 1 % GEL; Apply 2 g topically 4 (four) times daily. May alternate with heat and ice application for pain relief. May also alternate with acetaminophen as prescribed pain relief. Other alternatives include massage, acupuncture and water aerobics.  You must stay active and avoid a sedentary lifestyle.  Stress incontinence -     Ambulatory referral to Urology -     Urine Culture -     sulfamethoxazole-trimethoprim (BACTRIM DS) 800-160 MG tablet; Take 1 tablet by mouth 2 (two) times daily for 3 days.  Abnormal TSH -     TSH  Anxiety and depression -     hydrOXYzine (ATARAX/VISTARIL) 25 MG tablet; Take 1 tablet (25 mg total) by mouth every 8 (eight) hours as needed.    Patient has been counseled on age-appropriate routine health concerns for screening and prevention. These are reviewed and up-to-date. Referrals have been placed accordingly. Immunizations are up-to-date or declined.    Subjective:   Chief Complaint  Patient presents with  . Follow-up    Pt. Is here for 3 months follow up. Patient stated she cannot hold in her urine.    HPI Linda Hughes 75 y.o. female presents to office today for physical and with complaints of stress urinary incontinence. Onset 6 months ago. Urine with microscopic hematuria. Will treat as cystitis today with bactrimDS and refer to Urology as incontinence has been present for almost 65months.   has a past medical history of Coronary artery disease, GERD (gastroesophageal reflux  disease), High blood cholesterol, and Hypertension.  She sees Cardiology for nonobstructive CAD. Taking crestor currently with LDL 84. Cardiac symptoms include intermittent chest pressure (left sided) and palpitations. Taking metoprolol.    Right Shoulder Pain Intermittent for "quite some time". Worse with repetitive activity. She has not taken any medications for the pain. ROM is not limited.    Prediabetes Well controlled with diet only only at this time.  Lab Results  Component Value Date   HGBA1C 5.6 11/26/2019   Lab Results  Component Value Date   LDLCALC 84 04/21/2019   BP Readings from Last 3 Encounters:  03/22/20 112/67  11/26/19 123/70  11/24/19 113/64   Anxiety and Depression  She takes hydroxyzine prn and celexa 40 mg daily. Associates symptoms include: palpitations and chest pressure.  Lab Results  Component Value Date   TSH 4.350 11/16/2018    Review of Systems  Constitutional: Negative.  Negative for chills, fever, malaise/fatigue and weight loss.  HENT: Negative.   Eyes: Negative for pain and discharge.  Respiratory: Negative.  Negative for cough, sputum production, shortness of breath and wheezing.   Cardiovascular: Positive for palpitations. Negative for chest pain and leg swelling.  Gastrointestinal: Negative.  Negative for abdominal pain, blood in stool, constipation, diarrhea, heartburn, melena, nausea and vomiting.  Genitourinary: Positive for frequency and urgency. Negative for dysuria, flank pain and hematuria.       SEE HPI  Musculoskeletal: Positive for joint pain.  Skin: Negative.  Negative for rash.  Neurological: Negative.  Negative for dizziness, tremors, speech change, focal weakness, seizures and headaches.  Psychiatric/Behavioral: Positive for depression. Negative for suicidal ideas. The patient is nervous/anxious. The patient does not have insomnia.     Past Medical History:  Diagnosis Date  . Coronary artery disease   . GERD  (gastroesophageal reflux disease)   . High blood cholesterol   . Hypertension     Past Surgical History:  Procedure Laterality Date  . CESAREAN SECTION    . COLONOSCOPY N/A 01/06/2013   Procedure: COLONOSCOPY;  Surgeon: Barrie Folk, MD;  Location: [facility] ENDOSCOPY;  Service: Endoscopy;  Laterality: N/A;  . ESOPHAGOGASTRODUODENOSCOPY N/A 01/06/2013   Procedure: ESOPHAGOGASTRODUODENOSCOPY (EGD);  Surgeon: Barrie Folk, MD;  Location: [facility] ENDOSCOPY;  Service: Endoscopy;  Laterality: N/A;  . GALLBLADDER SURGERY      Family History  Problem Relation Age of Onset  . Breast cancer Sister        older sister   . Diabetes Sister   . Prostate cancer Brother        older brother   . Diabetes Brother   . Colon cancer Neg Hx   . Stomach cancer Neg Hx   . Pancreatic cancer Neg Hx   . Esophageal cancer Neg Hx     Social History Reviewed with no changes to be made today.   Outpatient Medications Prior to Visit  Medication Sig Dispense Refill  . citalopram (CELEXA) 40 MG tablet Take 1 tablet (40 mg total) by mouth daily. 90 tablet 3  . metoprolol tartrate (LOPRESSOR) 50 MG tablet Take 1 tablet (50 mg total) by mouth 2 (two) times daily. 180 tablet 3  . Multiple Vitamin (MULTIVITAMIN WITH MINERALS) TABS tablet Take 1 tablet by mouth daily. 90 tablet 3  . pantoprazole (PROTONIX) 40 MG tablet Take 1 tablet (40 mg total) by mouth 2 (two) times daily. 180 tablet 3  . rosuvastatin (CRESTOR) 10 MG tablet Take 1 tablet (10 mg total) by mouth daily. 90 tablet 3  . triamcinolone (KENALOG) 0.025 % cream Apply 1 application topically 2 (two) times daily. 30 g 1  . hydrOXYzine (ATARAX/VISTARIL) 25 MG tablet Take 1 tablet (25 mg total) by mouth every 8 (eight) hours as needed. 60 tablet 0   No facility-administered medications prior to visit.    No Known Allergies     Objective:    BP 112/67 (BP Location: Left Arm, Patient Position: Sitting, Cuff Size: Normal)   Pulse 61   Temp 98.6 F (37 C) (Oral)    Ht 5\' 1"  (1.549 m)   Wt 109 lb (49.4 kg)   SpO2 97%   BMI 20.60 kg/m  Wt Readings from Last 3 Encounters:  03/22/20 109 lb (49.4 kg)  11/26/19 107 lb (48.5 kg)  11/24/19 108 lb (49 kg)    Physical Exam Constitutional:      Appearance: She is well-developed and well-nourished.  HENT:     Head: Normocephalic and atraumatic.     Right Ear: Hearing, tympanic membrane, ear canal and external ear normal.     Left Ear: Hearing, tympanic membrane, ear canal and external ear normal.     Nose: Nose normal.     Mouth/Throat:     Mouth: Oropharynx is clear and moist.     Pharynx: No oropharyngeal exudate.  Eyes:     General: No scleral icterus.       Right eye: No discharge.  Extraocular Movements: EOM normal.     Conjunctiva/sclera: Conjunctivae normal.     Pupils: Pupils are equal, round, and reactive to light.  Neck:     Thyroid: No thyromegaly.     Trachea: No tracheal deviation.  Cardiovascular:     Rate and Rhythm: Normal rate and regular rhythm.     Pulses: Intact distal pulses.     Heart sounds: Normal heart sounds. No murmur heard. No friction rub.  Pulmonary:     Effort: Pulmonary effort is normal. No accessory muscle usage or respiratory distress.     Breath sounds: Normal breath sounds. No decreased breath sounds, wheezing, rhonchi or rales.  Chest:     Chest wall: No tenderness.  Breasts:     Right: No skin change or tenderness.     Left: No skin change or tenderness.    Abdominal:     General: Bowel sounds are normal. There is no distension.     Palpations: Abdomen is soft. There is no mass.     Tenderness: There is no abdominal tenderness. There is no guarding or rebound.  Musculoskeletal:        General: No tenderness, deformity or edema. Normal range of motion.     Cervical back: Normal range of motion and neck supple.  Lymphadenopathy:     Cervical: No cervical adenopathy.  Skin:    General: Skin is warm and dry.     Findings: No erythema.   Neurological:     Mental Status: She is alert and oriented to person, place, and time.     Cranial Nerves: No cranial nerve deficit.     Coordination: Coordination normal.     Deep Tendon Reflexes: Reflexes are normal and symmetric.     Reflex Scores:      Patellar reflexes are 2+ on the right side and 2+ on the left side. Psychiatric:        Attention and Perception: Attention normal.        Mood and Affect: Mood is anxious.        Speech: Speech normal.        Behavior: Behavior normal.        Thought Content: Thought content normal.        Judgment: Judgment normal.          Patient has been counseled extensively about nutrition and exercise as well as the importance of adherence with medications and regular follow-up. The patient was given clear instructions to go to ER or return to medical center if symptoms don't improve, worsen or new problems develop. The patient verbalized understanding.   Follow-up: Return in about 3 months (around 06/20/2020).   Claiborne Rigg, FNP-BC [facility] [place], [place] [phone]   03/22/2020, 9:52 PM

## 2020-03-23 LAB — TSH: TSH: 2.96 u[IU]/mL (ref 0.450–4.500)

## 2020-03-24 LAB — URINE CULTURE

## 2020-03-27 NOTE — Telephone Encounter (Signed)
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

## 2020-03-28 ENCOUNTER — Telehealth: Payer: Self-pay | Admitting: Nurse Practitioner

## 2020-03-28 NOTE — Telephone Encounter (Signed)
Copied from CRM [id number]. Topic: Quick Communication - See Telephone Encounter >> Mar 27, 2020  3:05 PM Aretta Nip wrote: CRM for notification. See Telephone encounter for: 03/27/20.Pt is  very upset that her BS was  high this past time for her BS and she is wanting to know what she can do it was 148 and is worried, pls have a nurse touch base. Pls call her [phone] She also was asking about a referral to Urology and does not understand. FU with pt

## 2020-04-06 NOTE — Telephone Encounter (Signed)
Spoke to patient and informed PCP refer her to Urology due to urine stress incontinence.  Patient was informed that her sugar reading will fluctuate every time and will not be the same every day.

## 2020-04-06 NOTE — Telephone Encounter (Signed)
CMA had add the COVID19 dates to patient's immunization history.

## 2020-04-26 ENCOUNTER — Other Ambulatory Visit: Payer: Self-pay | Admitting: Nurse Practitioner

## 2020-04-26 DIAGNOSIS — F32A Depression, unspecified: Secondary | ICD-10-CM

## 2020-04-26 DIAGNOSIS — F419 Anxiety disorder, unspecified: Secondary | ICD-10-CM

## 2020-04-26 MED FILL — ?METOPROLOL TART 50 MG TABL: 50 | 30 days supply | Qty: 60 | Fill #5

## 2020-04-26 MED FILL — hydrOXYzine HCL 25 MG TABS: 25 | 20 days supply | Qty: 60 | Fill #0

## 2020-04-26 MED FILL — PANTOPRAZOLE SOD DR 40 MG T: 40 | 30 days supply | Qty: 60 | Fill #5

## 2020-04-26 MED FILL — ROSUVASTATIN CALCIUM 10 MG: 10 | 30 days supply | Qty: 30 | Fill #5

## 2020-04-26 MED FILL — ?CITALOPRAM HBR 40 MG TABLE: 40 | 30 days supply | Qty: 30 | Fill #4

## 2020-04-26 NOTE — Progress Notes (Unsigned)
Cardiology Office Note:   Date:  04/27/2020  NAME:  Linda Hughes    MRN: [id number] DOB:  Aug 02, 1945   PCP:  Claiborne Rigg, NP  Cardiologist:  Reatha Harps, MD   Referring MD: Claiborne Rigg, NP   Chief Complaint  Patient presents with  . Follow-up    6 months.  . Headache   History of Present Illness:   Linda Hughes is a 75 y.o. female with a hx of non-obstructive CAD, HTN, HLD who presents for follow-up.  Her EKG is unchanged.  She reports she is doing well.  She is still troubled by the events in [place].  She reports she can get episodes of sharp chest pain.  Occur with stress.  Can occur at any time.  She is walking and doing activities such as yoga.  No significant chest pain during these episodes.  Her blood pressure is well controlled today.  Most recent LDL cholesterol is at goal.  We discussed that all of her numbers appear to be hitting the mark.  She apparently was told she was prediabetic and she did lose 10 pounds.  She is not close to diabetes in my opinion.  Overall she denies any significant chest pain symptoms.  Stress and anxiety appear to be a big issue here.  This is understandable.  I do sympathize with her.  Problem List 1. CAD -CCTA [place] (01/10/2018) -mild CAD 25-49% pLAD 2. HLD -T chol 170, HDL 70, LDL 84, TG 84 3. HTN  Past Medical History: Past Medical History:  Diagnosis Date  . Coronary artery disease   . GERD (gastroesophageal reflux disease)   . High blood cholesterol   . Hypertension     Past Surgical History: Past Surgical History:  Procedure Laterality Date  . CESAREAN SECTION    . COLONOSCOPY N/A 01/06/2013   Procedure: COLONOSCOPY;  Surgeon: Barrie Folk, MD;  Location: [facility] ENDOSCOPY;  Service: Endoscopy;  Laterality: N/A;  . ESOPHAGOGASTRODUODENOSCOPY N/A 01/06/2013   Procedure: ESOPHAGOGASTRODUODENOSCOPY (EGD);  Surgeon: Barrie Folk, MD;  Location: [facility] ENDOSCOPY;  Service: Endoscopy;  Laterality: N/A;  . GALLBLADDER  SURGERY      Current Medications: Current Meds  Medication Sig  . citalopram (CELEXA) 40 MG tablet Take 1 tablet (40 mg total) by mouth daily.  . hydrOXYzine (ATARAX/VISTARIL) 25 MG tablet TAKE 1 TABLET (25 MG TOTAL) BY MOUTH EVERY 8 (EIGHT) HOURS AS NEEDED.  . metoprolol tartrate (LOPRESSOR) 50 MG tablet Take 1 tablet (50 mg total) by mouth 2 (two) times daily.  . Multiple Vitamin (MULTIVITAMIN WITH MINERALS) TABS tablet Take 1 tablet by mouth daily.  . pantoprazole (PROTONIX) 40 MG tablet Take 1 tablet (40 mg total) by mouth 2 (two) times daily.  . rosuvastatin (CRESTOR) 10 MG tablet Take 1 tablet (10 mg total) by mouth daily.  Marland Kitchen triamcinolone (KENALOG) 0.025 % cream Apply 1 application topically 2 (two) times daily.     Allergies:    Patient has no allergy information on record.   Social History: Social History   Socioeconomic History  . Marital status: Married    Spouse name: Not on file  . Number of children: Not on file  . Years of education: Not on file  . Highest education level: Not on file  Occupational History  . Not on file  Tobacco Use  . Smoking status: Never Smoker  . Smokeless tobacco: Never Used  Vaping Use  . Vaping Use: Never used  Substance and Sexual Activity  . Alcohol use: No  . Drug use: No  . Sexual activity: Yes    Partners: Female, Female  Other Topics Concern  . Not on file  Social History Narrative  . Not on file   Social Determinants of Health   Financial Resource Strain: Not on file  Food Insecurity: Not on file  Transportation Needs: Not on file  Physical Activity: Not on file  Stress: Not on file  Social Connections: Not on file     Family History: The patient's family history includes Breast cancer in her sister; Diabetes in her brother and sister; Prostate cancer in her brother. There is no history of Colon cancer, Stomach cancer, Pancreatic cancer, or Esophageal cancer.  ROS:   All other ROS reviewed and negative. Pertinent  positives noted in the HPI.     EKGs/Labs/Other Studies Reviewed:   The following studies were personally reviewed by me today:  EKG:  EKG is ordered today.  The ekg ordered today demonstrates normal sinus rhythm heart rate 63, no acute ischemic changes, no evidence of prior infarction, and was personally reviewed by me.   Recent Labs: 06/01/2019: Hemoglobin 13.6; Platelets 176 01/05/2020: ALT 37; BUN 19; Creatinine, Ser 1.08; Potassium 4.2; Sodium 140 03/22/2020: TSH 2.960   Recent Lipid Panel    Component Value Date/Time   CHOL 170 04/21/2019 0901   TRIG 84 04/21/2019 0901   HDL 70 04/21/2019 0901   CHOLHDL 2.4 04/21/2019 0901   CHOLHDL 2.7 12/28/2015 0949   VLDL 18 12/28/2015 0949   LDLCALC 84 04/21/2019 0901    Physical Exam:   VS:  BP 120/64 (BP Location: Right Arm, Patient Position: Sitting, Cuff Size: Normal)   Pulse 63   Ht 5\' 1"  (1.549 m)   Wt 107 lb (48.5 kg)   BMI 20.22 kg/m    Wt Readings from Last 3 Encounters:  04/27/20 107 lb (48.5 kg)  03/22/20 109 lb (49.4 kg)  11/26/19 107 lb (48.5 kg)    General: Well nourished, well developed, in no acute distress Head: Atraumatic, normal size  Eyes: PEERLA, EOMI  Neck: Supple, no JVD Endocrine: No thryomegaly Cardiac: Normal S1, S2; RRR; no murmurs, rubs, or gallops Lungs: Clear to auscultation bilaterally, no wheezing, rhonchi or rales  Abd: Soft, nontender, no hepatomegaly  Ext: No edema, pulses 2+ Musculoskeletal: No deformities, BUE and BLE strength normal and equal Skin: Warm and dry, no rashes   Neuro: Alert and oriented to person, place, time, and situation, CNII-XII grossly intact, no focal deficits  Psych: Normal mood and affect   ASSESSMENT:   Linda Hughes is a 75 y.o. female who presents for the following: 1. Coronary artery disease involving native coronary artery of native heart without angina pectoris   2. Mixed hyperlipidemia   3. Essential hypertension     PLAN:   1. Coronary artery disease  involving native coronary artery of native heart without angina pectoris 2. Mixed hyperlipidemia -Mild CAD 25-49% in the proximal LAD in 2019 in 2020.  She has infrequent atypical chest pain.  Symptoms occur with stress.  EKG in office shows no ischemic changes or evidence of infarction.  No exertional chest pain symptoms when she exercises.  She should continue on aspirin.  Most recent LDL cholesterol 85.  I think this is okay.  She has very high HDL cholesterol.  I suspect overall she is low risk moving forward in life.  3. Essential hypertension -Controlled.  No  change in medications.   Disposition: Return in about 1 year (around 04/27/2021).  Medication Adjustments/Labs and Tests Ordered: Current medicines are reviewed at length with the patient today.  Concerns regarding medicines are outlined above.  Orders Placed This Encounter  Procedures  . EKG 12-Lead   No orders of the defined types were placed in this encounter.   Patient Instructions  Medication Instructions:  The current medical regimen is effective;  continue present plan and medications.  *If you need a refill on your cardiac medications before your next appointment, please call your pharmacy*   Follow-Up: At [facility], you and your health needs are our priority.  As part of our continuing mission to provide you with exceptional heart care, we have created designated Provider Care Teams.  These Care Teams include your primary Cardiologist (physician) and Advanced Practice Providers (APPs -  Physician Assistants and Nurse Practitioners) who all work together to provide you with the care you need, when you need it.  We recommend signing up for the patient portal called "MyChart".  Sign up information is provided on this After Visit Summary.  MyChart is used to connect with patients for Virtual Visits (Telemedicine).  Patients are able to view lab/test results, encounter notes, upcoming appointments, etc.   Non-urgent messages can be sent to your provider as well.   To learn more about what you can do with MyChart, go to [email or web address].    Your next appointment:   12 month(s)  The format for your next appointment:   In Person  Provider:   Lennie Odor, MD       Time Spent with Patient: I have spent a total of 25 minutes with patient reviewing hospital notes, telemetry, EKGs, labs and examining the patient as well as establishing an assessment and plan that was discussed with the patient.  > 50% of time was spent in direct patient care.  Signed, Lenna Gilford. Flora Lipps, MD [facility]  [address], [address] [place], [place] [address] [phone]  04/27/2020 3:05 PM

## 2020-04-27 ENCOUNTER — Other Ambulatory Visit: Payer: Self-pay

## 2020-04-27 ENCOUNTER — Ambulatory Visit (INDEPENDENT_AMBULATORY_CARE_PROVIDER_SITE_OTHER): Payer: No Typology Code available for payment source | Admitting: Cardiovascular Disease

## 2020-04-27 ENCOUNTER — Encounter: Payer: Self-pay | Admitting: Cardiovascular Disease

## 2020-04-27 VITALS — BP 120/64 | HR 63 | Ht 61.0 in | Wt 107.0 lb

## 2020-04-27 DIAGNOSIS — I10 Essential (primary) hypertension: Secondary | ICD-10-CM

## 2020-04-27 DIAGNOSIS — E782 Mixed hyperlipidemia: Secondary | ICD-10-CM

## 2020-04-27 DIAGNOSIS — I2510 Atherosclerotic heart disease of native coronary artery without angina pectoris: Secondary | ICD-10-CM

## 2020-04-27 NOTE — Patient Instructions (Signed)

## 2020-05-10 ENCOUNTER — Other Ambulatory Visit: Payer: Self-pay

## 2020-05-10 ENCOUNTER — Ambulatory Visit: Payer: Self-pay | Attending: Nurse Practitioner

## 2020-05-22 ENCOUNTER — Other Ambulatory Visit: Payer: Self-pay | Admitting: Nurse Practitioner

## 2020-05-22 ENCOUNTER — Other Ambulatory Visit: Payer: Self-pay

## 2020-05-22 ENCOUNTER — Ambulatory Visit: Payer: Self-pay | Attending: Nurse Practitioner

## 2020-05-22 DIAGNOSIS — F419 Anxiety disorder, unspecified: Secondary | ICD-10-CM

## 2020-05-22 DIAGNOSIS — F32A Depression, unspecified: Secondary | ICD-10-CM

## 2020-05-22 MED FILL — ?CITALOPRAM HBR 40 MG TABLE: 40 | 30 days supply | Qty: 30 | Fill #5

## 2020-05-22 MED FILL — ?METOPROLOL TART 50 MG TABL: 50 | 30 days supply | Qty: 60 | Fill #6

## 2020-05-22 MED FILL — PANTOPRAZOLE SOD DR 40 MG T: 40 | 30 days supply | Qty: 60 | Fill #6

## 2020-05-22 MED FILL — hydrOXYzine HCL 25 MG TABS: 25 | 20 days supply | Qty: 60 | Fill #0

## 2020-05-22 MED FILL — ROSUVASTATIN CALCIUM 10 MG: 10 | 30 days supply | Qty: 30 | Fill #6

## 2020-06-03 ENCOUNTER — Other Ambulatory Visit: Payer: Self-pay

## 2020-06-20 ENCOUNTER — Ambulatory Visit: Payer: Self-pay | Attending: Nurse Practitioner | Admitting: Nurse Practitioner

## 2020-06-20 ENCOUNTER — Other Ambulatory Visit: Payer: Self-pay

## 2020-06-20 ENCOUNTER — Other Ambulatory Visit: Payer: Self-pay | Admitting: Nurse Practitioner

## 2020-06-20 ENCOUNTER — Encounter: Payer: Self-pay | Admitting: Nurse Practitioner

## 2020-06-20 DIAGNOSIS — F419 Anxiety disorder, unspecified: Secondary | ICD-10-CM

## 2020-06-20 DIAGNOSIS — M25511 Pain in right shoulder: Secondary | ICD-10-CM

## 2020-06-20 DIAGNOSIS — I10 Essential (primary) hypertension: Secondary | ICD-10-CM

## 2020-06-20 DIAGNOSIS — K219 Gastro-esophageal reflux disease without esophagitis: Secondary | ICD-10-CM

## 2020-06-20 DIAGNOSIS — F32A Depression, unspecified: Secondary | ICD-10-CM

## 2020-06-20 DIAGNOSIS — E782 Mixed hyperlipidemia: Secondary | ICD-10-CM

## 2020-06-20 DIAGNOSIS — D649 Anemia, unspecified: Secondary | ICD-10-CM

## 2020-06-20 DIAGNOSIS — G8929 Other chronic pain: Secondary | ICD-10-CM

## 2020-06-20 DIAGNOSIS — R7303 Prediabetes: Secondary | ICD-10-CM

## 2020-06-20 MED ORDER — METOPROLOL TARTRATE 50 MG PO TABS
50.0000 mg | ORAL_TABLET | Freq: Two times a day (BID) | ORAL | 3 refills | Status: DC
  Filled 2020-06-20: qty 60, 30d supply, fill #0
  Filled 2020-07-23: qty 60, 30d supply, fill #1
  Filled 2020-08-24 (×2): qty 60, 30d supply, fill #2
  Filled 2020-09-20: qty 180, 90d supply, fill #3

## 2020-06-20 MED ORDER — PANTOPRAZOLE SODIUM 40 MG PO TBEC
DELAYED_RELEASE_TABLET | Freq: Two times a day (BID) | ORAL | 3 refills | Status: DC
  Filled 2020-06-20: qty 60, 30d supply, fill #0
  Filled 2020-07-23: qty 60, 30d supply, fill #1
  Filled 2020-08-24: qty 60, 30d supply, fill #2
  Filled 2020-09-20: qty 60, 30d supply, fill #3
  Filled 2020-10-25: qty 60, 30d supply, fill #4

## 2020-06-20 MED ORDER — DICLOFENAC SODIUM 1 % EX GEL
2.0000 g | Freq: Four times a day (QID) | CUTANEOUS | 1 refills | Status: DC
  Filled 2020-06-20: qty 100, 7d supply, fill #0

## 2020-06-20 MED ORDER — TRIAMCINOLONE ACETONIDE 0.025 % EX CREA
1.0000 "application " | TOPICAL_CREAM | Freq: Two times a day (BID) | CUTANEOUS | 1 refills | Status: DC
  Filled 2020-06-20: qty 30, 15d supply, fill #0

## 2020-06-20 MED ORDER — CITALOPRAM HYDROBROMIDE 40 MG PO TABS
ORAL_TABLET | Freq: Every day | ORAL | 3 refills | Status: DC
  Filled 2020-06-20: qty 30, 30d supply, fill #0
  Filled 2020-07-23: qty 30, 30d supply, fill #1
  Filled 2020-08-24 (×2): qty 30, 30d supply, fill #2
  Filled 2020-09-20: qty 30, 30d supply, fill #3
  Filled 2020-10-25: qty 30, 30d supply, fill #4

## 2020-06-20 MED ORDER — HYDROXYZINE HCL 25 MG PO TABS
ORAL_TABLET | ORAL | 0 refills | Status: DC
  Filled 2020-06-20: qty 60, 20d supply, fill #0

## 2020-06-20 MED ORDER — ROSUVASTATIN CALCIUM 10 MG PO TABS
ORAL_TABLET | Freq: Every day | ORAL | 3 refills | Status: DC
  Filled 2020-06-20: qty 30, 30d supply, fill #0
  Filled 2020-07-23: qty 30, 30d supply, fill #1
  Filled 2020-08-24 (×2): qty 30, 30d supply, fill #2
  Filled 2020-09-20: qty 30, 30d supply, fill #3
  Filled 2020-10-25: qty 30, 30d supply, fill #4

## 2020-06-20 NOTE — Progress Notes (Signed)
Virtual Visit via Telephone Note Due to national recommendations of social distancing due to Lilly 19, telehealth visit is felt to be most appropriate for this patient at this time.  I discussed the limitations, risks, security and privacy concerns of performing an evaluation and management service by telephone and the availability of in person appointments. I also discussed with the patient that there may be a patient responsible charge related to this service. The patient expressed understanding and agreed to proceed.    I connected with Linda Hughes on 06/20/20  at   1:30 PM EDT  EDT by telephone and verified that I am speaking with the correct person using two identifiers.   Consent I discussed the limitations, risks, security and privacy concerns of performing an evaluation and management service by telephone and the availability of in person appointments. I also discussed with the patient that there may be a patient responsible charge related to this service. The patient expressed understanding and agreed to proceed.   Location of Patient: Private  Residence   Location of Provider: [place] and [employer] Office    Persons participating in Telemedicine visit: [name] FNP-BC [address]    History of Present Illness: Telemedicine visit for: Follow Up She has a past medical history of Coronary artery disease, GERD, High blood cholesterol, and Hypertension.  Essential Hypertension Well controlled. Taking lopressor 50 mg BID. Denies chest pain, shortness of breath, palpitations, lightheadedness, dizziness, headaches or BLE edema.  BP Readings from Last 3 Encounters:  04/27/20 120/64  03/22/20 112/67  11/26/19 123/70   Anxiety and Depression Taking Celexa 40 mg daily as prescribed. She has been prescribed hydroxyzine 25 mg TID prn however she has not started taking this.   Prediabetes Well controlled without any oral diabetic medications. LDL at  goal with rosuvastatin 10 mg daily.  Lab Results  Component Value Date   HGBA1C 5.6 11/26/2019   Lab Results  Component Value Date   LDLCALC 84 04/21/2019    GERD Symptoms well controlled with pantoprazole 40 mg BID.    Past Medical History:  Diagnosis Date  . Coronary artery disease   . GERD (gastroesophageal reflux disease)   . High blood cholesterol   . Hypertension     Past Surgical History:  Procedure Laterality Date  . CESAREAN SECTION    . COLONOSCOPY N/A 01/06/2013   Procedure: COLONOSCOPY;  Surgeon: Missy Sabins, MD;  Location: [place];  Service: Endoscopy;  Laterality: N/A;  . ESOPHAGOGASTRODUODENOSCOPY N/A 01/06/2013   Procedure: ESOPHAGOGASTRODUODENOSCOPY (EGD);  Surgeon: Missy Sabins, MD;  Location: [facility] ENDOSCOPY;  Service: Endoscopy;  Laterality: N/A;  . GALLBLADDER SURGERY      Family History  Problem Relation Age of Onset  . Breast cancer Sister        older sister   . Diabetes Sister   . Prostate cancer Brother        older brother   . Diabetes Brother   . Colon cancer Neg Hx   . Stomach cancer Neg Hx   . Pancreatic cancer Neg Hx   . Esophageal cancer Neg Hx     Social History   Socioeconomic History  . Marital status: Married    Spouse name: Not on file  . Number of children: Not on file  . Years of education: Not on file  . Highest education level: Not on file  Occupational History  . Not on file  Tobacco Use  .  Smoking status: Never Smoker  . Smokeless tobacco: Never Used  Vaping Use  . Vaping Use: Never used  Substance and Sexual Activity  . Alcohol use: No  . Drug use: No  . Sexual activity: Yes    Partners: Female, Female  Other Topics Concern  . Not on file  Social History Narrative  . Not on file   Social Determinants of Health   Financial Resource Strain: Not on file  Food Insecurity: Not on file  Transportation Needs: Not on file  Physical Activity: Not on file  Stress: Not on file  Social Connections: Not on file      Observations/Objective: Awake, alert and oriented x 3   Review of Systems  Constitutional: Negative for fever, malaise/fatigue and weight loss.  HENT: Negative.  Negative for nosebleeds.   Eyes: Negative.  Negative for blurred vision, double vision and photophobia.  Respiratory: Negative.  Negative for cough and shortness of breath.   Cardiovascular: Negative.  Negative for chest pain, palpitations and leg swelling.  Gastrointestinal: Positive for heartburn. Negative for abdominal pain, blood in stool, constipation, diarrhea, melena, nausea and vomiting.  Musculoskeletal: Positive for joint pain. Negative for myalgias.  Neurological: Positive for sensory change. Negative for dizziness, focal weakness, seizures and headaches. Tremors: right thigh numnss.  Psychiatric/Behavioral: Positive for depression. Negative for suicidal ideas. The patient is nervous/anxious.     Assessment and Plan: Diagnoses and all orders for this visit:  Essential hypertension -     metoprolol tartrate (LOPRESSOR) 50 MG tablet; Take 1 tablet (50 mg total) by mouth 2 (two) times daily. -     CMP14+EGFR; Future Continue all antihypertensives as prescribed.  Remember to bring in your blood pressure log with you for your follow up appointment.  DASH/Mediterranean Diets are healthier choices for HTN.    Prediabetes -     Hemoglobin A1c; Future  Mixed hyperlipidemia -     rosuvastatin (CRESTOR) 10 MG tablet; TAKE 1 TABLET (10 MG TOTAL) BY MOUTH DAILY. -     Lipid panel; Future INSTRUCTIONS: Work on a low fat, heart healthy diet and participate in regular aerobic exercise program by working out at least 150 minutes per week; 5 days a week-30 minutes per day. Avoid red meat/beef/steak,  fried foods. junk foods, sodas, sugary drinks, unhealthy snacking, alcohol and smoking.  Drink at least 80 oz of water per day and monitor your carbohydrate intake daily.    Anxiety and depression -     citalopram (CELEXA) 40  MG tablet; TAKE 1 TABLET (40 MG TOTAL) BY MOUTH DAILY.  Gastroesophageal reflux disease, unspecified whether esophagitis present -     pantoprazole (PROTONIX) 40 MG tablet; TAKE 1 TABLET (40 MG TOTAL) BY MOUTH 2 (TWO) TIMES DAILY.  Chronic right shoulder pain -     diclofenac Sodium (VOLTAREN) 1 % GEL; APPLY 2 G TOPICALLY 4 (FOUR) TIMES DAILY.  Anemia, unspecified type -     CBC; Future  Other orders -     triamcinolone (KENALOG) 0.025 % cream; Apply 1 application topically 2 (two) times daily.     Follow Up Instructions Return in about 3 months (around 09/19/2020).     I discussed the assessment and treatment plan with the patient. The patient was provided an opportunity to ask questions and all were answered. The patient agreed with the plan and demonstrated an understanding of the instructions.   The patient was advised to call back or seek an in-person evaluation if the symptoms worsen  or if the condition fails to improve as anticipated.  I provided 19 minutes of non-face-to-face time during this encounter including median intraservice time, reviewing previous notes, labs, imaging, medications and explaining diagnosis and management.  Gildardo Pounds, FNP-BC

## 2020-06-21 ENCOUNTER — Other Ambulatory Visit: Payer: Self-pay

## 2020-06-21 ENCOUNTER — Ambulatory Visit: Payer: Self-pay | Attending: Nurse Practitioner

## 2020-06-21 DIAGNOSIS — D649 Anemia, unspecified: Secondary | ICD-10-CM

## 2020-06-21 DIAGNOSIS — E782 Mixed hyperlipidemia: Secondary | ICD-10-CM

## 2020-06-21 DIAGNOSIS — R7303 Prediabetes: Secondary | ICD-10-CM

## 2020-06-21 DIAGNOSIS — I10 Essential (primary) hypertension: Secondary | ICD-10-CM

## 2020-06-22 LAB — CMP14+EGFR
ALT: 45 IU/L — ABNORMAL HIGH (ref 0–32)
AST: 34 IU/L (ref 0–40)
Albumin/Globulin Ratio: 1.7 (ref 1.2–2.2)
Albumin: 4.5 g/dL (ref 3.7–4.7)
Alkaline Phosphatase: 86 IU/L (ref 44–121)
BUN/Creatinine Ratio: 17 (ref 12–28)
BUN: 18 mg/dL (ref 8–27)
Bilirubin Total: 0.6 mg/dL (ref 0.0–1.2)
CO2: 27 mmol/L (ref 20–29)
Calcium: 9.8 mg/dL (ref 8.7–10.3)
Chloride: 100 mmol/L (ref 96–106)
Creatinine, Ser: 1.09 mg/dL — ABNORMAL HIGH (ref 0.57–1.00)
Globulin, Total: 2.6 g/dL (ref 1.5–4.5)
Glucose: 97 mg/dL (ref 65–99)
Potassium: 4.3 mmol/L (ref 3.5–5.2)
Sodium: 142 mmol/L (ref 134–144)
Total Protein: 7.1 g/dL (ref 6.0–8.5)
eGFR: 53 mL/min/{1.73_m2} — ABNORMAL LOW (ref >59)

## 2020-06-22 LAB — CBC
Hematocrit: 43.6 % (ref 34.0–46.6)
Hemoglobin: 13.5 g/dL (ref 11.1–15.9)
MCH: 29.9 pg (ref 26.6–33.0)
MCHC: 31 g/dL — ABNORMAL LOW (ref 31.5–35.7)
MCV: 97 fL (ref 79–97)
Platelets: 160 10*3/uL (ref 150–450)
RBC: 4.51 x10E6/uL (ref 3.77–5.28)
RDW: 11.9 % (ref 11.7–15.4)
WBC: 3.9 10*3/uL (ref 3.4–10.8)

## 2020-06-22 LAB — LIPID PANEL
Chol/HDL Ratio: 2.1 ratio (ref 0.0–4.4)
Cholesterol, Total: 180 mg/dL (ref 100–199)
HDL: 85 mg/dL (ref >39)
LDL Chol Calc (NIH): 83 mg/dL (ref 0–99)
Triglycerides: 62 mg/dL (ref 0–149)
VLDL Cholesterol Cal: 12 mg/dL (ref 5–40)

## 2020-06-22 LAB — HEMOGLOBIN A1C
Est. average glucose Bld gHb Est-mCnc: 128 mg/dL
Hgb A1c MFr Bld: 6.1 % — ABNORMAL HIGH (ref 4.8–5.6)

## 2020-07-24 ENCOUNTER — Other Ambulatory Visit: Payer: Self-pay

## 2020-07-25 ENCOUNTER — Other Ambulatory Visit: Payer: Self-pay

## 2020-07-26 ENCOUNTER — Other Ambulatory Visit: Payer: Self-pay

## 2020-07-28 ENCOUNTER — Other Ambulatory Visit: Payer: Self-pay

## 2020-07-28 MED ORDER — MYRBETRIQ 25 MG PO TB24
25.0000 mg | ORAL_TABLET | ORAL | 11 refills | Status: DC
  Filled 2020-07-28 – 2020-08-24 (×2): qty 30, 30d supply, fill #0

## 2020-08-02 ENCOUNTER — Other Ambulatory Visit: Payer: Self-pay

## 2020-08-07 ENCOUNTER — Other Ambulatory Visit: Payer: Self-pay

## 2020-08-15 ENCOUNTER — Other Ambulatory Visit: Payer: Self-pay

## 2020-08-24 ENCOUNTER — Other Ambulatory Visit: Payer: Self-pay

## 2020-08-25 ENCOUNTER — Other Ambulatory Visit: Payer: Self-pay

## 2020-09-07 ENCOUNTER — Telehealth: Payer: Self-pay | Admitting: Nurse Practitioner

## 2020-09-07 NOTE — Telephone Encounter (Signed)
Copied from CRM [id number]. Topic: General - Inquiry >> Sep 05, 2020 10:57 AM Aretta Nip wrote: Wants to ask request for  [facility] specifically, personal please return call to pt at [phone]/this caller absolutely insistent that she personally call her back, pt told that not sure specific time as Meredeth Ide is quite busy today and pt understands, offer nurse cb declined   Called patient to see if I could find out more information. Patient requested to only speak with [facility]

## 2020-09-09 ENCOUNTER — Encounter: Payer: Self-pay | Admitting: Nurse Practitioner

## 2020-09-18 NOTE — Progress Notes (Signed)
Patient ID: Linda Hughes, female    DOB: 06-03-1945  MRN: [id number]  CC: Hypertension and Diabetes Follow-Up   Subjective: Linda Hughes is a 75 y.o. female who presents for hypertension and diabetes follow-up.  Her concerns today include:   HYPERTENSION FOLLOW-UP: 06/20/2020 at [facility] per NP note: Metoprolol   09/19/2020: Doing well on current regimen. No side effects. No issues/concerns. Denies chest pain and shortness of breath. Home blood pressures 110's-120's/70's-80's. She is experiencing mild intermittent dizziness and thinks it may be related to the Myrbetriq she receives from Urology. Reports Myrbetriq is a new medication for her.   2. PRE-DIABETES FOLLOW-UP: Would like referral to nutritionist. She has a strict diet and feels that she can hardly eat anything. Not taking any medication for prediabetes.   3. ABDOMINAL PAIN: On going for at least 1.5 months. Worse close to mealtime or with eating.  Location: generalized  Nausea/Vomiting: no Diarrhea: no Constipation: yes  Melena/BRBPR: no Fever/Chills: no Dysuria: no Back pain: no   Patient Active Problem List   Diagnosis Date Noted   Numbness and tingling in left arm 03/27/2015   Memory loss 03/27/2015   Essential hypertension 08/18/2014   Transaminasemia 02/21/2014   Precordial pain 10/21/2013   Visit for screening mammogram 07/20/2013   Dyspepsia 03/16/2013   Dyslipidemia 09/28/2012   Generalized anxiety disorder 09/28/2012   GERD (gastroesophageal reflux disease) 09/28/2012   DIVERTICULAR DISEASE 10/22/2006   HYPERCHOLESTEROLEMIA 10/21/2006   HYPERTENSION 10/21/2006   HEMORRHOIDS 10/21/2006   MENOPAUSAL SYNDROME 10/21/2006     Current Outpatient Medications on File Prior to Visit  Medication Sig Dispense Refill   citalopram (CELEXA) 40 MG tablet TAKE 1 TABLET (40 MG TOTAL) BY MOUTH DAILY. 90 tablet 3   diclofenac Sodium (VOLTAREN) 1 % GEL APPLY 2 G TOPICALLY 4  (FOUR) TIMES DAILY. 100 g 1   hydrOXYzine (ATARAX/VISTARIL) 25 MG tablet TAKE 1 TABLET (25 MG TOTAL) BY MOUTH EVERY 8 (EIGHT) HOURS AS NEEDED. 60 tablet 0   metoprolol tartrate (LOPRESSOR) 50 MG tablet Take 1 tablet (50 mg total) by mouth 2 (two) times daily. 180 tablet 3   mirabegron ER (MYRBETRIQ) 25 MG TB24 tablet Take 1 tablet (25 mg total) by mouth. 30 tablet 11   Multiple Vitamin (MULTIVITAMIN WITH MINERALS) TABS tablet Take 1 tablet by mouth daily. 90 tablet 3   pantoprazole (PROTONIX) 40 MG tablet TAKE 1 TABLET (40 MG TOTAL) BY MOUTH 2 (TWO) TIMES DAILY. 180 tablet 3   rosuvastatin (CRESTOR) 10 MG tablet TAKE 1 TABLET (10 MG TOTAL) BY MOUTH DAILY. 90 tablet 3   triamcinolone (KENALOG) 0.025 % cream Apply 1 application topically 2 (two) times daily. 30 g 1   [DISCONTINUED] diphenhydrAMINE (BENADRYL) 25 mg capsule Take 25 mg by mouth every 6 (six) hours as needed.     [DISCONTINUED] metoprolol succinate (TOPROL-XL) 50 MG 24 hr tablet Take 1 tablet (50 mg total) by mouth daily. Take with or immediately following a meal. 30 tablet 3   [DISCONTINUED] propranolol (INDERAL) 40 MG tablet Take 40 mg by mouth 3 (three) times daily.     No current facility-administered medications on file prior to visit.    Not on File  Social History   Socioeconomic History   Marital status: Married    Spouse name: Not on file   Number of children: Not on file   Years of education: Not on file   Highest education level: Not on file  Occupational History   Not on file  Tobacco Use   Smoking status: Never   Smokeless tobacco: Never  Vaping Use   Vaping Use: Never used  Substance and Sexual Activity   Alcohol use: No   Drug use: No   Sexual activity: Yes    Partners: Female, Female  Other Topics Concern   Not on file  Social History Narrative   Not on file   Social Determinants of Health   Financial Resource Strain: Not on file  Food Insecurity: Not on file  Transportation Needs: Not on file   Physical Activity: Not on file  Stress: Not on file  Social Connections: Not on file  Intimate Partner Violence: Not on file    Family History  Problem Relation Age of Onset   Breast cancer Sister        older sister    Diabetes Sister    Prostate cancer Brother        older brother    Diabetes Brother    Colon cancer Neg Hx    Stomach cancer Neg Hx    Pancreatic cancer Neg Hx    Esophageal cancer Neg Hx     Past Surgical History:  Procedure Laterality Date   CESAREAN SECTION     COLONOSCOPY N/A 01/06/2013   Procedure: COLONOSCOPY;  Surgeon: Missy Sabins, MD;  Location: [place];  Service: Endoscopy;  Laterality: N/A;   ESOPHAGOGASTRODUODENOSCOPY N/A 01/06/2013   Procedure: ESOPHAGOGASTRODUODENOSCOPY (EGD);  Surgeon: Missy Sabins, MD;  Location: [facility] ENDOSCOPY;  Service: Endoscopy;  Laterality: N/A;   GALLBLADDER SURGERY      ROS: Review of Systems Negative except as stated above  PHYSICAL EXAM: BP 132/78 (BP Location: Left Arm, Patient Position: Sitting, Cuff Size: Normal)   Pulse 60   Temp 97.9 F (36.6 C)   Resp 15   Ht 5' 0.98" (1.549 m)   Wt 100 lb 9.6 oz (45.6 kg)   SpO2 98%   BMI 19.02 kg/m   Physical Exam HENT:     Head: Normocephalic and atraumatic.  Eyes:     Extraocular Movements: Extraocular movements intact.     Conjunctiva/sclera: Conjunctivae normal.     Pupils: Pupils are equal, round, and reactive to light.  Cardiovascular:     Rate and Rhythm: Normal rate and regular rhythm.     Pulses: Normal pulses.     Heart sounds: Normal heart sounds.  Pulmonary:     Effort: Pulmonary effort is normal.     Breath sounds: Normal breath sounds.  Abdominal:     General: Bowel sounds are normal.     Palpations: Abdomen is soft.  Musculoskeletal:     Cervical back: Normal range of motion and neck supple.  Neurological:     Mental Status: She is alert.  Psychiatric:        Mood and Affect: Mood normal.        Behavior: Behavior normal.    ASSESSMENT AND PLAN: 1. Essential hypertension: - Continue Metoprolol as prescribed. - Patient experiencing mild intermittent dizziness. Do not suspect this is related to low blood pressures. Explained to patient that Mirabegron may be causing the side effect of dizziness as this is a new medication for her. Counseled for her to follow-up with the prescribing provider as Urology. Patient agreeable.  - Counseled on blood pressure goal of less than 140/90, low-sodium, DASH diet, medication compliance, 150 minutes of moderate intensity exercise per week as tolerated. Discussed medication compliance, adverse  effects. - Follow-up with primary provider in 3 months or sooner if needed.   2. Prediabetes: - Hemoglobin A1c today 5.7%. This is improved from previous hemoglobin A1c 6.1% on 06/21/2020. Next hemoglobin A1c due July 2023.  - Per patient request referral to Medical Nutrition Therapy for dietary counseling.  - POCT glycosylated hemoglobin (Hb A1C) - Amb ref to Medical Nutrition Therapy-MNT  3. Generalized abdominal pain: - Ongoing for a couple months.  - Ultrasound abdomen for further evaluation.  - CMP14+EGFR to check kidney function, liver function, and electrolyte balance.  - Referral to Gastroenterology for further evaluation and management.  - US Abdomen Complete; Future - Ambulatory referral to Gastroenterology - CMP14+EGFR; Future    Patient was given the opportunity to ask questions.  Patient verbalized understanding of the plan and was able to repeat key elements of the plan. Patient was given clear instructions to go to Emergency Department or return to medical center if symptoms don't improve, worsen, or new problems develop.The patient verbalized understanding.   Orders Placed This Encounter  Procedures   US Abdomen Complete   CMP14+EGFR   Amb ref to Medical Nutrition Therapy-MNT   Ambulatory referral to Gastroenterology   POCT glycosylated hemoglobin (Hb A1C)     Follow-up with primary provider in 3 months for hypertension and 6 months for diabetes.  Camillia Herter, NP

## 2020-09-19 ENCOUNTER — Encounter: Payer: Self-pay | Admitting: Family

## 2020-09-19 ENCOUNTER — Ambulatory Visit (INDEPENDENT_AMBULATORY_CARE_PROVIDER_SITE_OTHER): Payer: Self-pay | Admitting: Family

## 2020-09-19 ENCOUNTER — Other Ambulatory Visit: Payer: Self-pay

## 2020-09-19 VITALS — BP 132/78 | HR 60 | Temp 97.9°F | Resp 15 | Ht 60.98 in | Wt 100.6 lb

## 2020-09-19 DIAGNOSIS — R1084 Generalized abdominal pain: Secondary | ICD-10-CM

## 2020-09-19 DIAGNOSIS — R7303 Prediabetes: Secondary | ICD-10-CM

## 2020-09-19 DIAGNOSIS — I10 Essential (primary) hypertension: Secondary | ICD-10-CM

## 2020-09-19 LAB — POCT GLYCOSYLATED HEMOGLOBIN (HGB A1C): Hemoglobin A1C: 5.7 % — AB (ref 4.0–5.6)

## 2020-09-19 NOTE — Progress Notes (Signed)
Pt presents for diabetes and hypertension follow-up. Pt has been experiencing bilateral abdominal side pain

## 2020-09-20 ENCOUNTER — Other Ambulatory Visit: Payer: Self-pay

## 2020-09-20 ENCOUNTER — Ambulatory Visit: Payer: Self-pay | Attending: Nurse Practitioner

## 2020-09-20 DIAGNOSIS — R1084 Generalized abdominal pain: Secondary | ICD-10-CM

## 2020-09-21 LAB — CMP14+EGFR
ALT: 32 IU/L (ref 0–32)
AST: 37 IU/L (ref 0–40)
Albumin/Globulin Ratio: 1.9 (ref 1.2–2.2)
Albumin: 4.5 g/dL (ref 3.7–4.7)
Alkaline Phosphatase: 85 IU/L (ref 44–121)
BUN/Creatinine Ratio: 17 (ref 12–28)
BUN: 18 mg/dL (ref 8–27)
Bilirubin Total: 0.3 mg/dL (ref 0.0–1.2)
CO2: 28 mmol/L (ref 20–29)
Calcium: 9.7 mg/dL (ref 8.7–10.3)
Chloride: 101 mmol/L (ref 96–106)
Creatinine, Ser: 1.08 mg/dL — ABNORMAL HIGH (ref 0.57–1.00)
Globulin, Total: 2.4 g/dL (ref 1.5–4.5)
Glucose: 87 mg/dL (ref 65–99)
Potassium: 4.5 mmol/L (ref 3.5–5.2)
Sodium: 141 mmol/L (ref 134–144)
Total Protein: 6.9 g/dL (ref 6.0–8.5)
eGFR: 54 mL/min/{1.73_m2} — ABNORMAL LOW (ref >59)

## 2020-09-21 NOTE — Progress Notes (Signed)
Liver function normal.   Kidney function not 100% but about the same as 3 months ago. Over the counter medications such as Ibuprofen (Advil) or Naproxyn (Aleve) can be damaging to your kidneys and try to avoid those as much as possible. Tylenol or Acetaminophen is a safer option to use. Patient encouraged to schedule lab only visit to have kidney function rechecked in 4 to 6 weeks.

## 2020-10-06 ENCOUNTER — Other Ambulatory Visit: Payer: Self-pay

## 2020-10-06 ENCOUNTER — Ambulatory Visit (HOSPITAL_COMMUNITY)
Admission: RE | Admit: 2020-10-06 | Discharge: 2020-10-06 | Disposition: A | Discharge status: Home / Self Care | Payer: Self-pay | Source: Ambulatory Visit | Attending: Family | Admitting: Family

## 2020-10-06 DIAGNOSIS — R1084 Generalized abdominal pain: Secondary | ICD-10-CM | POA: Insufficient documentation

## 2020-10-25 ENCOUNTER — Other Ambulatory Visit: Payer: Self-pay

## 2020-10-27 ENCOUNTER — Other Ambulatory Visit: Payer: Self-pay

## 2020-10-27 ENCOUNTER — Ambulatory Visit: Payer: Self-pay | Attending: Nurse Practitioner

## 2020-10-27 DIAGNOSIS — R1084 Generalized abdominal pain: Secondary | ICD-10-CM

## 2020-10-28 LAB — CMP14+EGFR
ALT: 40 IU/L — ABNORMAL HIGH (ref 0–32)
AST: 33 IU/L (ref 0–40)
Albumin/Globulin Ratio: 2 (ref 1.2–2.2)
Albumin: 4.3 g/dL (ref 3.7–4.7)
Alkaline Phosphatase: 77 IU/L (ref 44–121)
BUN/Creatinine Ratio: 20 (ref 12–28)
BUN: 20 mg/dL (ref 8–27)
Bilirubin Total: 0.4 mg/dL (ref 0.0–1.2)
CO2: 28 mmol/L (ref 20–29)
Calcium: 9.1 mg/dL (ref 8.7–10.3)
Chloride: 99 mmol/L (ref 96–106)
Creatinine, Ser: 0.98 mg/dL (ref 0.57–1.00)
Globulin, Total: 2.2 g/dL (ref 1.5–4.5)
Glucose: 91 mg/dL (ref 65–99)
Potassium: 4.4 mmol/L (ref 3.5–5.2)
Sodium: 140 mmol/L (ref 134–144)
Total Protein: 6.5 g/dL (ref 6.0–8.5)
eGFR: 60 mL/min/{1.73_m2} (ref >59)

## 2020-11-07 ENCOUNTER — Ambulatory Visit: Payer: No Typology Code available for payment source | Admitting: Nurse Practitioner

## 2020-11-08 ENCOUNTER — Other Ambulatory Visit: Payer: Self-pay

## 2020-11-08 ENCOUNTER — Other Ambulatory Visit (HOSPITAL_BASED_OUTPATIENT_CLINIC_OR_DEPARTMENT_OTHER): Payer: Self-pay

## 2020-11-08 MED ORDER — MYRBETRIQ 25 MG PO TB24
ORAL_TABLET | ORAL | 11 refills | Status: AC
  Filled 2020-11-08: qty 30, fill #0
  Filled 2020-11-08: qty 30, 30d supply, fill #0

## 2020-11-10 ENCOUNTER — Encounter: Payer: Self-pay | Admitting: Nurse Practitioner

## 2020-11-10 ENCOUNTER — Other Ambulatory Visit: Payer: Self-pay

## 2020-11-10 ENCOUNTER — Ambulatory Visit: Payer: Self-pay | Attending: Nurse Practitioner | Admitting: Nurse Practitioner

## 2020-11-10 VITALS — BP 116/67 | HR 60 | Ht 60.0 in | Wt 101.0 lb

## 2020-11-10 DIAGNOSIS — F419 Anxiety disorder, unspecified: Secondary | ICD-10-CM

## 2020-11-10 DIAGNOSIS — K219 Gastro-esophageal reflux disease without esophagitis: Secondary | ICD-10-CM

## 2020-11-10 DIAGNOSIS — E782 Mixed hyperlipidemia: Secondary | ICD-10-CM

## 2020-11-10 DIAGNOSIS — F32A Depression, unspecified: Secondary | ICD-10-CM

## 2020-11-10 DIAGNOSIS — I10 Essential (primary) hypertension: Secondary | ICD-10-CM

## 2020-11-10 DIAGNOSIS — Z23 Encounter for immunization: Secondary | ICD-10-CM

## 2020-11-10 MED ORDER — PANTOPRAZOLE SODIUM 40 MG PO TBEC
DELAYED_RELEASE_TABLET | Freq: Two times a day (BID) | ORAL | 3 refills | Status: AC
Start: 2020-11-10 — End: 2021-11-10
  Filled 2020-11-10: qty 180, fill #0
  Filled 2020-11-24: qty 60, 30d supply, fill #0
  Filled 2020-12-19: qty 60, 30d supply, fill #1
  Filled 2021-01-29: qty 180, 90d supply, fill #2

## 2020-11-10 MED ORDER — HYDROXYZINE HCL 25 MG PO TABS
ORAL_TABLET | ORAL | 1 refills | Status: DC
Start: 2020-11-10 — End: 2021-01-24
  Filled 2020-11-10: qty 60, 20d supply, fill #0

## 2020-11-10 MED ORDER — CITALOPRAM HYDROBROMIDE 40 MG PO TABS
ORAL_TABLET | Freq: Every day | ORAL | 3 refills | Status: DC
  Filled 2020-11-10: qty 90, fill #0
  Filled 2020-11-24: qty 30, 30d supply, fill #0
  Filled 2020-12-19: qty 30, 30d supply, fill #1

## 2020-11-10 MED ORDER — METOPROLOL TARTRATE 50 MG PO TABS
50.0000 mg | ORAL_TABLET | Freq: Two times a day (BID) | ORAL | 3 refills | Status: AC
  Filled 2020-11-10: qty 180, 90d supply, fill #0
  Filled 2020-12-19: qty 60, 30d supply, fill #0
  Filled 2021-01-29: qty 180, 90d supply, fill #1

## 2020-11-10 MED ORDER — ROSUVASTATIN CALCIUM 10 MG PO TABS
ORAL_TABLET | Freq: Every day | ORAL | 3 refills | Status: AC
  Filled 2020-11-10: qty 90, fill #0
  Filled 2020-11-23: qty 30, 30d supply, fill #0
  Filled 2020-12-19: qty 30, 30d supply, fill #1
  Filled 2021-01-29: qty 90, 90d supply, fill #2

## 2020-11-10 NOTE — Progress Notes (Signed)
Assessment & Plan:  [name] was seen today for medication refill.  Diagnoses and all orders for this visit:  Essential hypertension -     metoprolol tartrate (LOPRESSOR) 50 MG tablet; Take 1 tablet (50 mg total) by mouth 2 (two) times daily. Continue all antihypertensives as prescribed.  Remember to bring in your blood pressure log with you for your follow up appointment.  DASH/Mediterranean Diets are healthier choices for HTN.    Mixed hyperlipidemia -     rosuvastatin (CRESTOR) 10 MG tablet; TAKE 1 TABLET (10 MG TOTAL) BY MOUTH DAILY. INSTRUCTIONS: Work on a low fat, heart healthy diet and participate in regular aerobic exercise program by working out at least 150 minutes per week; 5 days a week-30 minutes per day. Avoid red meat/beef/steak,  fried foods. junk foods, sodas, sugary drinks, unhealthy snacking, alcohol and smoking.  Drink at least 80 oz of water per day and monitor your carbohydrate intake daily.    Gastroesophageal reflux disease, unspecified whether esophagitis present -     pantoprazole (PROTONIX) 40 MG tablet; TAKE 1 TABLET (40 MG TOTAL) BY MOUTH 2 (TWO) TIMES DAILY. INSTRUCTIONS: Avoid GERD Triggers: acidic, spicy or fried foods, caffeine, coffee, sodas,  alcohol and chocolate.    Anxiety and depression -     hydrOXYzine (ATARAX/VISTARIL) 25 MG tablet; TAKE 1 TABLET (25 MG TOTAL) BY MOUTH EVERY 8 (EIGHT) HOURS AS NEEDED. -     citalopram (CELEXA) 40 MG tablet; TAKE 1 TABLET (40 MG TOTAL) BY MOUTH DAILY.  Need for immunization against influenza -     Flu Vaccine QUAD 28mo+IM (Fluarix, Fluzone & Alfiuria Quad PF)   Patient has been counseled on age-appropriate routine health concerns for screening and prevention. These are reviewed and up-to-date. Referrals have been placed accordingly. Immunizations are up-to-date or declined.    Subjective:   Chief Complaint  Patient presents with   Medication Refill   HPI Linda Hughes 75 y.o. female presents to office today  for medication refills.  She has a past medical history of Coronary artery disease, GERD, High blood cholesterol, and Hypertension.   HTN Well controlled with metoprolol 50mg  BID. Denies chest pain, shortness of breath, palpitations, lightheadedness, dizziness, headaches or BLE edema.   BP Readings from Last 3 Encounters:  11/10/20 116/67  09/19/20 132/78  04/27/20 120/64     Anxiety and Depression Taking hydroxyzine and celexa 40 mg daily. She does endorse increased worrying regarding her prediabetes and not being able to eat the foods she desires. She is checking her blood glucose levels by fingerstick daily which I have advised against as she is not diabetic. She has an upcoming appt with the dietician and hopefully this will help relieve some of her anxiety We did speak in great detail today regarding nutrition, reading labels and portion/carb control   Review of Systems  Constitutional:  Negative for fever, malaise/fatigue and weight loss.  HENT: Negative.  Negative for nosebleeds.   Eyes: Negative.  Negative for blurred vision, double vision and photophobia.  Respiratory: Negative.  Negative for cough and shortness of breath.   Cardiovascular: Negative.  Negative for chest pain, palpitations and leg swelling.  Gastrointestinal:  Positive for heartburn. Negative for nausea and vomiting.  Musculoskeletal: Negative.  Negative for myalgias.  Neurological: Negative.  Negative for dizziness, focal weakness, seizures and headaches.  Psychiatric/Behavioral:  Positive for depression. Negative for suicidal ideas. The patient is nervous/anxious.    Past Medical History:  Diagnosis Date   Coronary artery  disease    GERD (gastroesophageal reflux disease)    High blood cholesterol    Hypertension     Past Surgical History:  Procedure Laterality Date   CESAREAN SECTION     COLONOSCOPY N/A 01/06/2013   Procedure: COLONOSCOPY;  Surgeon: Barrie Folk, MD;  Location[facility] ENDOSCOPY;  Service:  Endoscopy;  Laterality: N/A;   ESOPHAGOGASTRODUODENOSCOPY N/A 01/06/2013   Procedure: ESOPHAGOGASTRODUODENOSCOPY (EGD);  Surgeon: Barrie Folk, MD;  Location: [facility] ENDOSCOPY;  Service: Endoscopy;  Laterality: N/A;   GALLBLADDER SURGERY      Family History  Problem Relation Age of Onset   Breast cancer Sister        older sister    Diabetes Sister    Prostate cancer Brother        older brother    Diabetes Brother    Colon cancer Neg Hx    Stomach cancer Neg Hx    Pancreatic cancer Neg Hx    Esophageal cancer Neg Hx     Social History Reviewed with no changes to be made today.   Outpatient Medications Prior to Visit  Medication Sig Dispense Refill   mirabegron ER (MYRBETRIQ) 25 MG TB24 tablet Take 1 tablet (25 mg total) by mouth. 30 tablet 11   mirabegron ER (MYRBETRIQ) 25 MG TB24 tablet 1 tablet PO QD 30 tablet 11   Multiple Vitamin (MULTIVITAMIN WITH MINERALS) TABS tablet Take 1 tablet by mouth daily. 90 tablet 3   citalopram (CELEXA) 40 MG tablet TAKE 1 TABLET (40 MG TOTAL) BY MOUTH DAILY. 90 tablet 3   hydrOXYzine (ATARAX/VISTARIL) 25 MG tablet TAKE 1 TABLET (25 MG TOTAL) BY MOUTH EVERY 8 (EIGHT) HOURS AS NEEDED. 60 tablet 0   metoprolol tartrate (LOPRESSOR) 50 MG tablet Take 1 tablet (50 mg total) by mouth 2 (two) times daily. 180 tablet 3   pantoprazole (PROTONIX) 40 MG tablet TAKE 1 TABLET (40 MG TOTAL) BY MOUTH 2 (TWO) TIMES DAILY. 180 tablet 3   rosuvastatin (CRESTOR) 10 MG tablet TAKE 1 TABLET (10 MG TOTAL) BY MOUTH DAILY. 90 tablet 3   diclofenac Sodium (VOLTAREN) 1 % GEL APPLY 2 G TOPICALLY 4 (FOUR) TIMES DAILY. (Patient not taking: Reported on 11/10/2020) 100 g 1   triamcinolone (KENALOG) 0.025 % cream Apply 1 application topically 2 (two) times daily. (Patient not taking: Reported on 11/10/2020) 30 g 1   No facility-administered medications prior to visit.    No Known Allergies     Objective:    BP 116/67   Pulse 60   Ht 5' (1.524 m)   Wt 101 lb (45.8 kg)   SpO2  100%   BMI 19.73 kg/m  Wt Readings from Last 3 Encounters:  11/10/20 101 lb (45.8 kg)  09/19/20 100 lb 9.6 oz (45.6 kg)  04/27/20 107 lb (48.5 kg)    Physical Exam Vitals and nursing note reviewed.  Constitutional:      Appearance: She is well-developed.  HENT:     Head: Normocephalic and atraumatic.  Cardiovascular:     Rate and Rhythm: Normal rate and regular rhythm.     Heart sounds: Normal heart sounds. No murmur heard.   No friction rub. No gallop.  Pulmonary:     Effort: Pulmonary effort is normal. No tachypnea or respiratory distress.     Breath sounds: Normal breath sounds. No decreased breath sounds, wheezing, rhonchi or rales.  Chest:     Chest wall: No tenderness.  Abdominal:     General: Bowel  sounds are normal.     Palpations: Abdomen is soft.  Musculoskeletal:        General: Normal range of motion.     Cervical back: Normal range of motion.  Skin:    General: Skin is warm and dry.  Neurological:     Mental Status: She is alert and oriented to person, place, and time.     Coordination: Coordination normal.  Psychiatric:        Attention and Perception: Attention normal.        Mood and Affect: Mood is anxious.        Speech: Speech normal.        Behavior: Behavior normal. Behavior is cooperative.        Thought Content: Thought content normal.        Cognition and Memory: Cognition normal.        Judgment: Judgment normal.         Patient has been counseled extensively about nutrition and exercise as well as the importance of adherence with medications and regular follow-up. The patient was given clear instructions to go to ER or return to medical center if symptoms don't improve, worsen or new problems develop. The patient verbalized understanding.   Follow-up: Return in about 12 weeks (around 02/02/2021).   Claiborne Rigg, FNP-BC [facility] [place], [place] [phone]   11/12/2020, 9:40 AM

## 2020-11-12 ENCOUNTER — Encounter: Payer: Self-pay | Admitting: Nurse Practitioner

## 2020-11-13 ENCOUNTER — Encounter: Payer: No Typology Code available for payment source | Attending: Family | Admitting: Skilled Nursing Facility1

## 2020-11-13 ENCOUNTER — Other Ambulatory Visit: Payer: Self-pay

## 2020-11-13 DIAGNOSIS — R7303 Prediabetes: Secondary | ICD-10-CM | POA: Insufficient documentation

## 2020-11-13 NOTE — Progress Notes (Signed)
Medical Nutrition Therapy   Primary concerns today: prediabetes  Referral diagnosis: prediabetes  NUTRITION ASSESSMENT    Clinical Medical Hx: prediabetes Medications: see list Labs: A1C 5.7 Notable Signs/Symptoms: none identified   Lifestyle & Dietary Hx  Pt states she has lost 10 pounds and cut out all rice and increased her exercise.  Pt state she feels hungry often and not filled.  Pt states her daughter and son in law will take care of her and help her with different things. Pt states she has hobbies like sewing and walks to the dollar store or [employer] Pts she does have anxiety and depression and states she does worry too much. Pt states she does have overactive bladder but her medication has helped. Pt sates she hates having to take so much medication.  Pt states he does not eat butter.  Estimated daily fluid intake:  oz Supplements:  Sleep: 5-6  Stress / self-care: scared of the pandemic Current average weekly physical activity: walking to the store, 30 minute yoga and videos and tai chi 5 days a week  24-Hr Dietary Recall First Meal: 1 piece toast + half avocado + oatmeal + 1 egg + 2% lactaid milk Snack: nuts + crackers Second Meal 12:30-1:  broccoli or cauliflower or spring mix + chicken (pepper and salt) or fish (limiting herself to half the can of tuna) Snack: nuts or seeds Third Meal: broccoli or cauliflower or spring mix + chicken (pepper and salt) or fish (limiting herself to half the can of tuna) Snack:  Beverages: coffee, water  Estimated Energy Needs Calories: 1500   NUTRITION INTERVENTION  Nutrition education (E-1) on the following topics:  Prediabetes underweight  Handouts Provided Include  Detailed MyPlate marked for pt specifically   Learning Style & Readiness for Change Teaching method utilized: Visual & Auditory  Demonstrated degree of understanding via: Teach Back  Barriers to learning/adherence to lifestyle change: fear of developing  diabetes  Goals Established by Pt Add in 2 complex carbohydrates per meal and a piece of fruit as a snack Limit desserts to 2 per week You are aiming for 1 pound gain per week and to call if losing or maintaining    MONITORING & EVALUATION Dietary intake, weekly physical activity  Next Steps  Patient is to email as needed.

## 2020-11-22 ENCOUNTER — Encounter: Payer: Self-pay | Admitting: Nurse Practitioner

## 2020-11-22 ENCOUNTER — Ambulatory Visit (INDEPENDENT_AMBULATORY_CARE_PROVIDER_SITE_OTHER): Payer: No Typology Code available for payment source | Admitting: Nurse Practitioner

## 2020-11-22 VITALS — BP 105/70 | HR 60 | Ht 60.0 in | Wt 101.0 lb

## 2020-11-22 DIAGNOSIS — R1010 Upper abdominal pain, unspecified: Secondary | ICD-10-CM

## 2020-11-22 DIAGNOSIS — R634 Abnormal weight loss: Secondary | ICD-10-CM

## 2020-11-22 NOTE — Progress Notes (Signed)
ASSESSMENT AND PLAN    #75 year old Asian female with chronic, intermittent upper abdominal pain: Flare of upper abdominal pain a couple of months ago, now resolved.  Perhaps she has functional dyspepsia ?  --Previously evaluated by [facility] in 2014 and [place] in 2019 with findings of a mild gastritis ( negative H.pylori stain) on EGD.  Following EGD she declined CT scan as her upper GI abdominal pain had resolved.  --Negative RUQ [place] Aug 2022 >> post cholecystectomy. CBD 40mm --She has had mild weight loss which she attributes to a significant reduction in carbohydrate intake as part of pre-diabetes management.  --Even though pain has resolved I will bring her back to see me in a few weeks.  Hopefully by adding some carbohydrates back into her diet ( as suggested by Dietician) she will have picked up a few pounds.  If not and/or if she has recurrent upper abdominal pain then can proceed with CT scan of the abdomen and pelvis.  --Additionally, maybe we can reduce the amount of acid blocking medications she takes since she has chronic intermittent upper abdominal pain despite high-dose PPI plus H2 blocker.   HISTORY OF PRESENT ILLNESS    Chief Complaint : upper abdominal pain ( improved)  Linda Hughes is a 75 y.o. female from [place] known to Dr. Christella Hartigan  with a past medical history significant for GAD, depression, hypertension, hyperlipidemia, diverticulosis, cholecystectomy gastritis. See PMH below for any additional medical problems.   Sept 2021 - office visit ( new patient). Seen for evaluation of upper abdominal pain. (Previously evaluated by [facility] with findings of mild gastritis on EGD in 2014).  She was already taking pantoprazole at the time . She was scheduled for EGD (her colonoscopy was not due until 2024) which showed only mild gastritis   We recommended doubling PPI to twice daily.  We also wanted a CT scan of the abdomen and pelvis. However when we called to get this arrange she  was already feeling better and wanted to postpone imaging    INTERVAL HISTORY: Patient speaks English but communication was somewhat limited by the language barrier.  This appointment was made in early August  for abdominal pain.   July 2022 office visit with PCP for at least a 1.5 month history of abdominal pain.  Labs including CMP and CBC unremarkable (except for mildly elevated ALT of 40).  RUQ ultrasound Aug 2022 unremarkable  Patient tells me her upper abdominal pain has resolved. . Initially it was generalized across her whole upper abdomen and then localized to the LUQ.  She recalls that the pain improved with eating.  She has not had any associated nausea/vomiting.  She has continued to take pantoprazole twice daily plus Pepcid daily .  No NSAID use . Her bowel movements are normal .  She recently had an unintentional weight loss of 5 pounds .  Patient says she is prediabetic and had to make some dietary changes.  She reduced carbohydrate intake and incorporated more seeds and nuts into her diet. Dietician recently told her she needed to add back some carbohydrates.     PREVIOUS ENDOSCOPIC EVALUATIONS / PERTINENT STUDIES:   EGD 11/24/19 for abdominal pain --Gastritis. 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  No H. pylori.    Past Medical History:  Diagnosis Date   Coronary artery disease    GERD (gastroesophageal reflux disease)    High blood cholesterol    Hypertension  Current Medications, Allergies, Past Surgical History, Family History and Social History were reviewed in [employer] record.   Current Outpatient Medications  Medication Sig Dispense Refill   citalopram (CELEXA) 40 MG tablet TAKE 1 TABLET (40 MG TOTAL) BY MOUTH DAILY. 90 tablet 3   diclofenac Sodium (VOLTAREN) 1 % GEL APPLY 2 G TOPICALLY 4 (FOUR) TIMES DAILY. (Patient not taking: Reported on 11/10/2020) 100 g 1   hydrOXYzine (ATARAX/VISTARIL) 25 MG tablet  TAKE 1 TABLET (25 MG TOTAL) BY MOUTH EVERY 8 (EIGHT) HOURS AS NEEDED. 60 tablet 1   metoprolol tartrate (LOPRESSOR) 50 MG tablet Take 1 tablet (50 mg total) by mouth 2 (two) times daily. 180 tablet 3   mirabegron ER (MYRBETRIQ) 25 MG TB24 tablet Take 1 tablet (25 mg total) by mouth. 30 tablet 11   mirabegron ER (MYRBETRIQ) 25 MG TB24 tablet 1 tablet PO QD 30 tablet 11   Multiple Vitamin (MULTIVITAMIN WITH MINERALS) TABS tablet Take 1 tablet by mouth daily. 90 tablet 3   pantoprazole (PROTONIX) 40 MG tablet TAKE 1 TABLET (40 MG TOTAL) BY MOUTH 2 (TWO) TIMES DAILY. 180 tablet 3   rosuvastatin (CRESTOR) 10 MG tablet TAKE 1 TABLET (10 MG TOTAL) BY MOUTH DAILY. 90 tablet 3   triamcinolone (KENALOG) 0.025 % cream Apply 1 application topically 2 (two) times daily. (Patient not taking: Reported on 11/10/2020) 30 g 1   No current facility-administered medications for this visit.    Review of Systems: No chest pain. No shortness of breath. No urinary complaints.   PHYSICAL EXAM :    Wt Readings from Last 3 Encounters:  11/13/20 100 lb (45.4 kg)  11/10/20 101 lb (45.8 kg)  09/19/20 100 lb 9.6 oz (45.6 kg)    BP 105/70   Pulse 60   Ht 5' (1.524 m)   Wt 101 lb (45.8 kg)   SpO2 98%   BMI 19.73 kg/m  Constitutional:  Pleasant thin female in no acute distress. Psychiatric: Normal mood and affect. Behavior is normal. EENT: Pupils normal.  Conjunctivae are normal. No scleral icterus. Neck supple.  Cardiovascular: Normal rate, regular rhythm. No edema Pulmonary/chest: Effort normal and breath sounds normal. No wheezing, rales or rhonchi. Abdominal: Soft, nondistended, nontender. Bowel sounds active throughout. There are no masses palpable. No hepatomegaly. Neurological: Alert and oriented to person place and time. Skin: Skin is warm and dry. No rashes noted.  Willette Cluster, NP  11/22/2020, 9:10 AM  Cc:  Claiborne Rigg, NP

## 2020-11-22 NOTE — Patient Instructions (Addendum)
If you are age 76 or older, your body mass index should be between 23-30. Your Body mass index is 19.73 kg/m. If this is out of the aforementioned range listed, please consider follow up with your Primary Care Provider.  If you are age 29 or younger, your body mass index should be between 19-25. Your Body mass index is 19.73 kg/m. If this is out of the aformentioned range listed, please consider follow up with your Primary Care Provider.   __________________________________________________________  The [facility] providers would like to encourage you to use [facility] to communicate with providers for non-urgent requests or questions.  Due to long hold times on the telephone, sending your provider a message by [facility] may be a faster and more efficient way to get a response.  Please allow 48 business hours for a response.  Please remember that this is for non-urgent requests.   We have scheduled you for an appointment on Thursday, 10-27 at 2:30 pm.  Thank you for entrusting me with your care and for choosing [facility], Willette Cluster, NP-C

## 2020-11-23 ENCOUNTER — Other Ambulatory Visit: Payer: Self-pay

## 2020-11-23 NOTE — Progress Notes (Signed)
I agree with the above note, plan 

## 2020-11-24 ENCOUNTER — Ambulatory Visit: Payer: Self-pay | Attending: Nurse Practitioner

## 2020-11-24 ENCOUNTER — Other Ambulatory Visit: Payer: Self-pay

## 2020-12-19 ENCOUNTER — Other Ambulatory Visit: Payer: Self-pay

## 2020-12-20 ENCOUNTER — Other Ambulatory Visit: Payer: Self-pay

## 2020-12-20 ENCOUNTER — Ambulatory Visit: Payer: Self-pay | Attending: Physician Assistant | Admitting: Physician Assistant

## 2020-12-20 ENCOUNTER — Encounter: Payer: Self-pay | Admitting: Physician Assistant

## 2020-12-20 VITALS — BP 129/81 | HR 63 | Temp 98.9°F | Resp 18 | Ht 60.0 in | Wt 102.0 lb

## 2020-12-20 DIAGNOSIS — K649 Unspecified hemorrhoids: Secondary | ICD-10-CM

## 2020-12-20 DIAGNOSIS — K5900 Constipation, unspecified: Secondary | ICD-10-CM

## 2020-12-20 NOTE — Progress Notes (Signed)
Established Patient Office Visit  Subjective:  Patient ID: Linda Hughes, female    DOB: 1945/12/28  Age: 75 y.o. MRN: [id number]  CC:  Chief Complaint  Patient presents with   Constipation   Hemorrhoids    HPI Linda Hughes reports that she has been experiencing constipation for the past week.  Reports due to the constipation, she did have some difficulty with hemorrhoids.  Reports that she has an external hemorrhoid that has "been there for a long time", but became larger due to being constipated.  Reports that she did use a stool softener after a few days of constipation, took 2 doses and states that she was able to pass some stool.  Reports that she has been using Preparation H on the hemorrhoid with relief.  Denies any changes in medication, but does state that she did start eating more protein in her diet.  Reports that she is unsure if that caused a change in her bowels.  Reports that she works very hard to eat lots of vegetables, and drink more water.  Reports that she has been doing this since she was diagnosed with prediabetes and has lost 10 pounds with her effort.  States that the last 2 days her bowel movements have been better but still not her normal bowel movements.  Denies abdominal pain, denies rectal pain or bleeding.     Past Medical History:  Diagnosis Date   Coronary artery disease    GERD (gastroesophageal reflux disease)    High blood cholesterol    Hypertension     Past Surgical History:  Procedure Laterality Date   CESAREAN SECTION     COLONOSCOPY N/A 01/06/2013   Procedure: COLONOSCOPY;  Surgeon: Missy Sabins, MD;  Location: [place];  Service: Endoscopy;  Laterality: N/A;   ESOPHAGOGASTRODUODENOSCOPY N/A 01/06/2013   Procedure: ESOPHAGOGASTRODUODENOSCOPY (EGD);  Surgeon: Missy Sabins, MD;  Location[facility] ENDOSCOPY;  Service: Endoscopy;  Laterality: N/A;   GALLBLADDER SURGERY      Family History  Problem Relation Age of Onset   Breast cancer  Sister        older sister    Diabetes Sister    Prostate cancer Brother        older brother    Diabetes Brother    Colon cancer Neg Hx    Stomach cancer Neg Hx    Pancreatic cancer Neg Hx    Esophageal cancer Neg Hx     Social History   Socioeconomic History   Marital status: Married    Spouse name: Not on file   Number of children: Not on file   Years of education: Not on file   Highest education level: Not on file  Occupational History   Not on file  Tobacco Use   Smoking status: Never   Smokeless tobacco: Never  Vaping Use   Vaping Use: Never used  Substance and Sexual Activity   Alcohol use: No   Drug use: No   Sexual activity: Yes    Partners: Female, Female  Other Topics Concern   Not on file  Social History Narrative   Not on file   Social Determinants of Health   Financial Resource Strain: Not on file  Food Insecurity: Not on file  Transportation Needs: Not on file  Physical Activity: Not on file  Stress: Not on file  Social Connections: Not on file  Intimate Partner Violence: Not on file    Outpatient Medications Prior to Visit  Medication Sig Dispense Refill   Calcium Carbonate-Vit D-Min (CALCIUM 1200 PO) Take by mouth. Take one tablet daily     cholecalciferol (VITAMIN D3) 25 MCG (1000 UNIT) tablet Take 1,000 Units by mouth daily.     citalopram (CELEXA) 40 MG tablet TAKE 1 TABLET (40 MG TOTAL) BY MOUTH DAILY. 90 tablet 3   co-enzyme Q-10 30 MG capsule Take 30 mg by mouth daily.     hydrOXYzine (ATARAX/VISTARIL) 25 MG tablet TAKE 1 TABLET (25 MG TOTAL) BY MOUTH EVERY 8 (EIGHT) HOURS AS NEEDED. 60 tablet 1   metoprolol tartrate (LOPRESSOR) 50 MG tablet Take 1 tablet (50 mg total) by mouth 2 (two) times daily. 180 tablet 3   mirabegron ER (MYRBETRIQ) 25 MG TB24 tablet 1 tablet PO QD 30 tablet 11   Multiple Vitamin (MULTIVITAMIN WITH MINERALS) TABS tablet Take 1 tablet by mouth daily. 90 tablet 3   pantoprazole (PROTONIX) 40 MG tablet TAKE 1 TABLET  (40 MG TOTAL) BY MOUTH 2 (TWO) TIMES DAILY. (Patient taking differently: Take by mouth daily.) 180 tablet 3   rosuvastatin (CRESTOR) 10 MG tablet TAKE 1 TABLET (10 MG TOTAL) BY MOUTH DAILY. 90 tablet 3   vitamin B-12 (CYANOCOBALAMIN) 1000 MCG tablet Take 1,000 mcg by mouth daily.     vitamin C (ASCORBIC ACID) 500 MG tablet Take 500 mg by mouth daily.     famotidine (PEPCID) 20 MG tablet Take 20 mg by mouth daily.     No facility-administered medications prior to visit.    No Known Allergies  ROS Review of Systems  Constitutional:  Negative for chills and fever.  HENT: Negative.    Eyes: Negative.   Respiratory:  Negative for shortness of breath.   Cardiovascular:  Negative for chest pain.  Gastrointestinal:  Positive for constipation. Negative for abdominal pain, anal bleeding, blood in stool and rectal pain.  Endocrine: Negative.   Genitourinary: Negative.   Musculoskeletal: Negative.   Skin: Negative.   Allergic/Immunologic: Negative.   Neurological: Negative.   Hematological: Negative.      Objective:    Physical Exam Vitals and nursing note reviewed.  Constitutional:      Appearance: Normal appearance.  HENT:     Head: Normocephalic and atraumatic.     Right Ear: External ear normal.     Left Ear: External ear normal.     Nose: Nose normal.     Mouth/Throat:     Mouth: Mucous membranes are moist.     Pharynx: Oropharynx is clear.  Eyes:     Extraocular Movements: Extraocular movements intact.     Conjunctiva/sclera: Conjunctivae normal.     Pupils: Pupils are equal, round, and reactive to light.  Cardiovascular:     Rate and Rhythm: Normal rate and regular rhythm.     Pulses: Normal pulses.     Heart sounds: Normal heart sounds.  Pulmonary:     Effort: Pulmonary effort is normal.     Breath sounds: Normal breath sounds.  Abdominal:     General: Abdomen is flat.     Palpations: Abdomen is soft.     Tenderness: There is no abdominal tenderness.   Musculoskeletal:        General: Normal range of motion.     Cervical back: Normal range of motion and neck supple.  Skin:    General: Skin is warm and dry.  Neurological:     General: No focal deficit present.     Mental Status: She is alert and  oriented to person, place, and time.  Psychiatric:        Mood and Affect: Mood normal.        Behavior: Behavior normal.        Thought Content: Thought content normal.        Judgment: Judgment normal.    BP 129/81 (BP Location: Left Arm, Patient Position: Sitting, Cuff Size: Normal)   Pulse 63   Temp 98.9 F (37.2 C) (Oral)   Resp 18   Ht 5' (1.524 m)   Wt 102 lb (46.3 kg)   SpO2 99%   BMI 19.92 kg/m  Wt Readings from Last 3 Encounters:  12/20/20 102 lb (46.3 kg)  11/22/20 101 lb (45.8 kg)  11/13/20 100 lb (45.4 kg)     Health Maintenance Due  Topic Date Due   COVID-19 Vaccine (4 - Booster for Pfizer series) 02/04/2020    There are no preventive care reminders to display for this patient.  Lab Results  Component Value Date   TSH 2.960 03/22/2020   Lab Results  Component Value Date   WBC 3.9 06/21/2020   HGB 13.5 06/21/2020   HCT 43.6 06/21/2020   MCV 97 06/21/2020   PLT 160 06/21/2020   Lab Results  Component Value Date   NA 140 10/27/2020   K 4.4 10/27/2020   CO2 28 10/27/2020   GLUCOSE 91 10/27/2020   BUN 20 10/27/2020   CREATININE 0.98 10/27/2020   BILITOT 0.4 10/27/2020   ALKPHOS 77 10/27/2020   AST 33 10/27/2020   ALT 40 (H) 10/27/2020   PROT 6.5 10/27/2020   ALBUMIN 4.3 10/27/2020   CALCIUM 9.1 10/27/2020   EGFR 60 10/27/2020   Lab Results  Component Value Date   CHOL 180 06/21/2020   Lab Results  Component Value Date   HDL 85 06/21/2020   Lab Results  Component Value Date   LDLCALC 83 06/21/2020   Lab Results  Component Value Date   TRIG 62 06/21/2020   Lab Results  Component Value Date   CHOLHDL 2.1 06/21/2020   Lab Results  Component Value Date   HGBA1C 5.7 (A) 09/19/2020       Assessment & Plan:   Problem List Items Addressed This Visit       Cardiovascular and Mediastinum   Hemorrhoid   Other Visit Diagnoses     Constipation, unspecified constipation type    -  Primary       No orders of the defined types were placed in this encounter. 1. Constipation, unspecified constipation type Patient education given on supportive care, how to track fiber in her diet.  Red flags given for prompt reevaluation.  2. Hemorrhoids, unspecified hemorrhoid type Continue Preparation H as needed, red flags given for prompt reevaluation.   I have reviewed the patient's medical history (PMH, PSH, Social History, Family History, Medications, and allergies) , and have been updated if relevant. I spent 20 minutes reviewing chart and  face to face time with patient.     Follow-up: Return if symptoms worsen or fail to improve.    Loraine Grip Mayers, PA-C

## 2020-12-20 NOTE — Patient Instructions (Signed)
I encourage you to monitor your fiber intake for the next 2 weeks, try to aim for 30 g of fiber a day.  Please let Linda Hughes know if your constipation does not resolve.  Roney Jaffe, PA-C Physician Assistant [facility] [email or web address]   Fiber Content in Foods Fiber is a substance that is found in plant foods, such as fruits, vegetables, whole grains, nuts, seeds, and beans. As part of your treatment and recovery plan, your health care provider may recommend that you eat foods that have specific amounts of dietary fiber. Some conditions may require a high-fiber diet while others may require a low-fiber diet. This sheet gives you information about the dietary fiber content of some common foods. Your health care provider will tell you how much fiber you need in your diet. If you have problems or questions, contact your health care provider or dietitian. What foods are high in fiber? Fruits Blackberries or raspberries (fresh) --  cup (75 g) has 4 g of fiber. Pear (fresh) -- 1 medium (180 g) has 5.5 g of fiber. Prunes (dried) -- 6 to 8 pieces (57-76 g) has 5 g of fiber. Apple with skin -- 1 medium (182 g) has 4.8 g of fiber. Guava -- 1 cup (128 g) has 8.9 g of fiber. Vegetables Peas (frozen) --  cup (80 g) has 4.4 g of fiber. Potato with skin (baked) -- 1 medium (173 g) has 4.4 g of fiber. Pumpkin (canned) --  cup (122 g) has 5 g of fiber. Brussels sprouts (cooked) --  cup (78 g) has 4 g of fiber. Sweet potato --  cup mashed (124 g) has 4 g of fiber. Winter squash -- 1 cup cooked (205 g) has 5.7 g of fiber. Grains Bran cereal --  cup (31 g) has 8.6 g of fiber. Bulgur (cooked) --  cup (70 g) has 4 g of fiber. Quinoa (cooked) -- 1 cup (185 g) has 5.2 g of fiber. Popcorn -- 3 cups (375 g) popped has 5.8 g of fiber. Spaghetti, whole wheat -- 1 cup (140 g) has 6 g of fiber. Meats and other proteins Pinto beans (cooked) --  cup  (90 g) has 7.7 g of fiber. Lentils (cooked) --  cup (90 g) has 7.8 g of fiber. Kidney beans (canned) --  cup (92.5 g) has 5.7 g of fiber. Soybeans (canned, frozen, or fresh) --  cup (92.5 g) has 5.2 g of fiber. Baked beans, plain or vegetarian (canned) --  cup (130 g) has 5.2 g of fiber. Garbanzo beans or chickpeas (canned) --  cup (90 g) has 6.6 g of fiber. Black beans (cooked) --  cup (86 g) has 7.5 g of fiber. White beans or navy beans (cooked) --  cup (91 g) has 9.3 g of fiber. The items listed above may not be a complete list of foods with high fiber. Actual amounts of fiber may be different depending on processing. Contact a dietitian for more information. What foods are moderate in fiber? Fruits Banana -- 1 medium (126 g) has 3.2 g of fiber. Melon -- 1 cup (155 g) has 1.4 g of fiber. Orange -- 1 small (154 g) has 3.7 g of fiber. Raisins --  cup (40 g) has 1.8 g of fiber. Applesauce, sweetened --  cup (125 g) has 1.5 g of fiber. Blueberries (fresh) --  cup (75 g) has 1.8 g of fiber. Strawberries (fresh, sliced) -- 1 cup (150 g) has 3 g of  fiber. Cherries -- 1 cup (140 g) has 2.9 g of fiber. Vegetables Broccoli (cooked) --  cup (77.5 g) has 2.1 g of fiber. Carrots (cooked) --  cup (77.5 g) has 2.2 g of fiber. Corn (canned or frozen) --  cup (82.5 g) has 2.1 g of fiber. Potatoes, mashed --  cup (105 g) has 1.6 g of fiber. Tomato -- 1 medium (62 g) has 1.5 g of fiber. Green beans (canned) --  cup (83 g) has 2 g of fiber. Squash, winter --  cup (58 g) has 1 g of fiber. Sweet potato, baked -- 1 medium (150 g) has 3 g of fiber. Cauliflower (cooked) -- 1/2 cup (90 g) has 2.3 g of fiber. Grains Long-grain brown rice (cooked) -- 1 cup (196 g) has 3.5 g of fiber. Bagel, plain -- one 4-inch (10 cm) bagel has 2 g of fiber. Instant oatmeal --  cup (120 g) has about 2 g of fiber. Macaroni noodles, enriched (cooked) -- 1 cup (140 g) has 2.5 g of fiber. Multigrain cereal --   cup (15 g) has about 2-4 g of fiber. Whole-wheat bread -- 1 slice (26 g) has 2 g of fiber. Whole-wheat spaghetti noodles --  cup (70 g) has 3.2 g of fiber. Corn tortilla -- one 6-inch (15 cm) tortilla has 1.5 g of fiber. Meats and other proteins Almonds --  cup or 1 oz (28 g) has 3.5 g of fiber. Sunflower seeds in shell --  cup or  oz (11.5 g) has 1.1 g of fiber. Vegetable or soy patty -- 1 patty (70 g) has 3.4 g of fiber. Walnuts --  cup or 1 oz (30 g) has 2 g of fiber. Flax seed -- 1 Tbsp (7 g) has 2.8 g of fiber. The items listed above may not be a complete list of foods that have moderate amounts of fiber. Actual amounts of fiber may be different depending on processing. Contact a dietitian for more information. What foods are low in fiber? Low-fiber foods contain less than 1 g of fiber per serving. They include: Fruits Fruit juice --  cup or 4 fl oz (118 mL) has 0.5 g of fiber. Vegetables Lettuce -- 1 cup (35 g) has 0.5 g of fiber. Cucumber (slices) --  cup (60 g) has 0.3 g of fiber. Celery -- 1 stalk (40 g) has 0.1 g of fiber. Grains Flour tortilla -- one 6-inch (15 cm) tortilla has 0.5 g of fiber. White rice (cooked) --  cup (81.5 g) has 0.3 g of fiber. Meats and other proteins Egg -- 1 large (50 g) has 0 g of fiber. Meat, poultry, or fish -- 3 oz (85 g) has 0 g of fiber. Dairy Milk -- 1 cup or 8 fl oz (237 mL) has 0 g of fiber. Yogurt -- 1 cup (245 g) has 0 g of fiber. The items listed above may not be a complete list of foods that are low in fiber. Actual amounts of fiber may be different depending on processing. Contact a dietitian for more information. Summary Fiber is a substance that is found in plant foods, such as fruits, vegetables, whole grains, nuts, seeds, and beans. As part of your treatment and recovery plan, your health care provider may recommend that you eat foods that have specific amounts of dietary fiber. This information is not intended to replace  advice given to you by your health care provider. Make sure you discuss any questions you have with your  health care provider. Document Revised: 06/24/2019 Document Reviewed: 06/24/2019 Elsevier Patient Education  2022 [employer].

## 2020-12-20 NOTE — Progress Notes (Signed)
Patient has eaten today. Patient reports not being able to move her bowels in the past week. Patients normal is every two days. Patient complains of hemorrhoid presenting.

## 2020-12-28 ENCOUNTER — Encounter: Payer: Self-pay | Admitting: Nurse Practitioner

## 2020-12-28 ENCOUNTER — Ambulatory Visit (INDEPENDENT_AMBULATORY_CARE_PROVIDER_SITE_OTHER): Payer: Self-pay | Admitting: Nurse Practitioner

## 2020-12-28 VITALS — BP 114/70 | HR 64 | Ht 60.0 in | Wt 101.0 lb

## 2020-12-28 DIAGNOSIS — R1010 Upper abdominal pain, unspecified: Secondary | ICD-10-CM

## 2020-12-28 NOTE — Progress Notes (Signed)
ASSESSMENT AND PLAN    # 75 yo female with chronic, intermittent upper abdominal pain of unclear etiology. Upper endoscopy ( x2) has shown only H.pylori. Abdominal US unremarkable and her labs have been okay. CT scan has never been done since pain has usually resolved by the time she is seen in clinic. Her weight has stabilized.  --No further abdominal pain since I saw her in clinic last month --I thought about reducing her acid blocking regimen of PPI + Pepcid. However, she is under a lot of stress right now and in case the medications are helping I don't want to cause recurrent abdominal pain and add to her anxiety / depression. Continue PPI and Pepcid for now.  --Patient will call if she gets recurrent abdominal pain at which point we can schedule her for a CTAP.    # Colon cancer screening . Due for 10 year screening colonoscopy November 2024. Will reevaluate later whether this is still reasonable   # Depression / anxiety. She is really sad about the loss of a close friend and the tragic death of her dog in 2022-09-21. She was trying to ask me about her celexa dose. From what I can tell she is taking a portion of a 40 mg table to get to 30 mg?  Inquiring if she can just go to 40 mg. I have asked her to talk with her PCP about whether this is appropriate. She is having problems sleeping and inquires about a sleeping pill. I recommended a trial of Melatonin. Discussed sleep hygiene.   # Recent constipation, resolved after dose of stool softeners. Discussed that she can take stool softeners if needed but she says the constipation has resolved.      HISTORY OF PRESENT ILLNESS    Chief Complaint : follow up on abdominal pain  Linda Hughes is a 75 y.o. female from [place] known to Dr. Christella Hartigan with a past medical history significant for cholecystectomy, depression, GAD, HTN, hyperlipidemia, diverticulosis. See PMH below for any additional medical problems.    Patient has chronic, intermittent  upper abdominal pain previously evaluated with EGD by [facility] ( gastritis). She was seen here for the same pain in Sept 2021 and again in September 2022. Generally by the time she comes to the appointment patient is feeling better and further evaluation with abdominal imaging has been postponed. She had an EGD by Dr. Christella Hartigan in January 2021 ( erosive gastritis, no H.pylori) and an unremarkable abdominal US in Aug 2022.   I last saw Linda Hughes September 2022 at which time her upper abdominal pain had already resolved. She had lost some weight loss but attributed it to a reduction in carbohydrate intake as part of prediabetes management.  Even though the pain had resolved I wanted to bring her back to see me in a few weeks with plans for a CT scan of the abdomen if pain had recurred or she was still losing weight.  I also recommended she add back some carbohydrates to her diet as was also suggested by her Dietitian.  Because she had continued to have episodic pain despite PPI and H2 blocker I thought that maybe we could reduce her medication regimen at next follow up. .   INTERVAL HISTORY:  Linda Hughes has returned as requested. She hasn't been able to gain any weight though she hasn't lost any either. She is still working with the Dietician for management of pre-diabetes. She reiterates that the  Dietician has recommended she reincorporate some rice / other carbohydrates into her diet   Patient hasn't had any further upper abdominal pain. She did recently develop problems with gas and constipation. The constipation aggravated her hemorrhoids but they have improved with prep H. She took two stool softeners a couple of weeks ago and had good results. Since then her bowels have been moving okay so she hasn't needed to take any more stool softeners.   Linda Linda Hughes has been under a lot of stress after loss of a close friend . Additionally her dog was struck and killed by a car on July 4th.  She feels depressed and has  periods of significant anxiety. She is taking Celexa but I am a little confused about her dosage. She says that 10 mg tablets are not available so she is taking a portion of a 40 mg tablet? She cannot sleep and is thinking about asking her PCP for sleeping meds.    PREVIOUS ENDOSCOPIC EVALUATIONS / PERTINENT STUDIES:   2014 screening colonoscopy --Diverticulosis --10-year follow-up recommended  Sept 2021 EGD for abdominal pain --Moderate inflammation characterized by erosions, erythema, friability and granularity was found in the gastric antrum and in the prepyloric region of the stomach. Biopsies were taken with a cold forceps for histology. 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  No H. pylori.    2014 EGD for LUQ pain --Mild gastritis   Past Medical History:  Diagnosis Date   Coronary artery disease    GERD (gastroesophageal reflux disease)    High blood cholesterol    Hypertension     Current Medications, Allergies, Past Surgical History, Family History and Social History were reviewed in [employer] record.   Current Outpatient Medications  Medication Sig Dispense Refill   Calcium Carbonate-Vit D-Min (CALCIUM 1200 PO) Take by mouth. Take one tablet daily     cholecalciferol (VITAMIN D3) 25 MCG (1000 UNIT) tablet Take 1,000 Units by mouth daily.     citalopram (CELEXA) 40 MG tablet TAKE 1 TABLET (40 MG TOTAL) BY MOUTH DAILY. 90 tablet 3   co-enzyme Q-10 30 MG capsule Take 30 mg by mouth daily.     hydrOXYzine (ATARAX/VISTARIL) 25 MG tablet TAKE 1 TABLET (25 MG TOTAL) BY MOUTH EVERY 8 (EIGHT) HOURS AS NEEDED. 60 tablet 1   metoprolol tartrate (LOPRESSOR) 50 MG tablet Take 1 tablet (50 mg total) by mouth 2 (two) times daily. 180 tablet 3   mirabegron ER (MYRBETRIQ) 25 MG TB24 tablet 1 tablet PO QD 30 tablet 11   Multiple Vitamin (MULTIVITAMIN WITH MINERALS) TABS tablet Take 1 tablet by mouth daily. 90 tablet 3   pantoprazole  (PROTONIX) 40 MG tablet TAKE 1 TABLET (40 MG TOTAL) BY MOUTH 2 (TWO) TIMES DAILY. 180 tablet 3   rosuvastatin (CRESTOR) 10 MG tablet TAKE 1 TABLET (10 MG TOTAL) BY MOUTH DAILY. 90 tablet 3   vitamin B-12 (CYANOCOBALAMIN) 1000 MCG tablet Take 1,000 mcg by mouth daily.     vitamin C (ASCORBIC ACID) 500 MG tablet Take 500 mg by mouth daily.     No current facility-administered medications for this visit.    Review of Systems: No chest pain. No shortness of breath. No urinary complaints.   PHYSICAL EXAM :    Wt Readings from Last 3 Encounters:  12/28/20 101 lb (45.8 kg)  12/20/20 102 lb (46.3 kg)  11/22/20 101 lb (45.8 kg)    Ht 5' (1.524 m)   Wt 101 lb (45.8  kg)   BMI 19.73 kg/m  Constitutional:  Generally well appearing thin female in no acute distress. Psychiatric: Pleasant. Normal mood and affect. Behavior is normal. EENT: Pupils normal.  Conjunctivae are normal. No scleral icterus. Neck supple.  Cardiovascular: Normal rate, regular rhythm. No edema Pulmonary/chest: Effort normal and breath sounds normal. No wheezing, rales or rhonchi. Abdominal: Soft, nondistended, nontender. Bowel sounds active throughout. There are no masses palpable. No hepatomegaly. Neurological: Alert and oriented to person place and time. Skin: Skin is warm and dry. No rashes noted.  Willette Cluster, NP  12/28/2020, 2:14 PM  Cc:  Claiborne Rigg, NP

## 2020-12-28 NOTE — Patient Instructions (Signed)
If you are age 76 or older, your body mass index should be between 23-30. Your Body mass index is 19.73 kg/m. If this is out of the aforementioned range listed, please consider follow up with your Primary Care Provider.  The [facility] providers would like to encourage you to use [facility] to communicate with providers for non-urgent requests or questions.  Due to long hold times on the telephone, sending your provider a message by [facility] may be faster and more efficient way to get a response. Please allow 48 business hours for a response.  Please remember that this is for non-urgent requests/questions.  RECOMMENDATIONS: You can use Gas-X as needed. For sleep you can do a trial of Melatonin.  If you have any recurrent abdominal pain, call us or send a message on MyChart message.  It was great seeing you today! Thank you for entrusting me with your care and choosing [facility]  Willette Cluster, NP

## 2020-12-29 NOTE — Progress Notes (Signed)
I agree with the above note, plan 

## 2021-01-02 ENCOUNTER — Encounter: Payer: Self-pay | Admitting: Nurse Practitioner

## 2021-01-02 ENCOUNTER — Telehealth (HOSPITAL_BASED_OUTPATIENT_CLINIC_OR_DEPARTMENT_OTHER): Payer: No Typology Code available for payment source | Admitting: Nurse Practitioner

## 2021-01-02 DIAGNOSIS — F5105 Insomnia due to other mental disorder: Secondary | ICD-10-CM

## 2021-01-02 DIAGNOSIS — F32A Depression, unspecified: Secondary | ICD-10-CM

## 2021-01-02 DIAGNOSIS — F418 Other specified anxiety disorders: Secondary | ICD-10-CM

## 2021-01-02 DIAGNOSIS — F419 Anxiety disorder, unspecified: Secondary | ICD-10-CM

## 2021-01-02 NOTE — Progress Notes (Signed)
Virtual Visit via Telephone Note Due to national recommendations of social distancing due to COVID 19, telehealth visit is felt to be most appropriate for this patient at this time.  I discussed the limitations, risks, security and privacy concerns of performing an evaluation and management service by telephone and the availability of in person appointments. I also discussed with the patient that there may be a patient responsible charge related to this service. The patient expressed understanding and agreed to proceed.    I connected with Linda Hughes on 01/02/21  at   2:10 PM EDT  EDT by telephone and verified that I am speaking with the correct person using two identifiers.  Location of Patient: Private Residence   [facility] of Provider[facility]    Persons participating in Telemedicine visit: [name] Cathrine Roslynn Amble    History of Present Illness: Telemedicine visit for: Anxiety and Depression She has a past medical history of Coronary artery disease, GERD, HPL and Hypertension.   She decreased her celexa to 30 mg and now feels like her depression has worsened. Also endorses insomnia. Recently purchased melatonin and notices some improvement in insomnia however no complete resolution with 3 mg nightly.   Constipation Occurring over the past few days. She purchased hemorrhoid cream and stool softener. Now with complete resolution of symptoms. She normally has a BM every other day and denies chronic constipation.    Past Medical History:  Diagnosis Date   Coronary artery disease    GERD (gastroesophageal reflux disease)    High blood cholesterol    Hypertension     Past Surgical History:  Procedure Laterality Date   CESAREAN SECTION     COLONOSCOPY N/A 01/06/2013   Procedure: COLONOSCOPY;  Surgeon: Barrie Folk, MD;  Location: [facility] ENDOSCOPY;  Service: Endoscopy;  Laterality: N/A;   ESOPHAGOGASTRODUODENOSCOPY N/A 01/06/2013    Procedure: ESOPHAGOGASTRODUODENOSCOPY (EGD);  Surgeon: Barrie Folk, MD;  Location: [facility] ENDOSCOPY;  Service: Endoscopy;  Laterality: N/A;   GALLBLADDER SURGERY      Family History  Problem Relation Age of Onset   Breast cancer Sister        older sister    Diabetes Sister    Prostate cancer Brother        older brother    Diabetes Brother    Colon cancer Neg Hx    Stomach cancer Neg Hx    Pancreatic cancer Neg Hx    Esophageal cancer Neg Hx     Social History   Socioeconomic History   Marital status: Married    Spouse name: Not on file   Number of children: Not on file   Years of education: Not on file   Highest education level: Not on file  Occupational History   Not on file  Tobacco Use   Smoking status: Never   Smokeless tobacco: Never  Vaping Use   Vaping Use: Never used  Substance and Sexual Activity   Alcohol use: No   Drug use: No   Sexual activity: Yes    Partners: Female, Female  Other Topics Concern   Not on file  Social History Narrative   Not on file   Social Determinants of Health   Financial Resource Strain: Not on file  Food Insecurity: Not on file  Transportation Needs: Not on file  Physical Activity: Not on file  Stress: Not on file  Social Connections: Not on file     Observations/Objective: Awake, alert  and oriented x 3   Review of Systems  Constitutional:  Negative for fever, malaise/fatigue and weight loss.  HENT: Negative.  Negative for nosebleeds.   Eyes: Negative.  Negative for blurred vision, double vision and photophobia.  Respiratory: Negative.  Negative for cough and shortness of breath.   Cardiovascular: Negative.  Negative for chest pain, palpitations and leg swelling.  Gastrointestinal: Negative.  Negative for heartburn, nausea and vomiting.  Musculoskeletal: Negative.  Negative for myalgias.  Neurological: Negative.  Negative for dizziness, focal weakness, seizures and headaches.  Psychiatric/Behavioral:  Positive for  depression. Negative for suicidal ideas. The patient is nervous/anxious and has insomnia.    Assessment and Plan: Diagnoses and all orders for this visit:  Anxiety and depression Increase celexa to 40 mg daily  Insomnia secondary to depression with anxiety Increase melatonin to 6mg  nightly as needed    Follow Up Instructions Return if symptoms worsen or fail to improve.     I discussed the assessment and treatment plan with the patient. The patient was provided an opportunity to ask questions and all were answered. The patient agreed with the plan and demonstrated an understanding of the instructions.   The patient was advised to call back or seek an in-person evaluation if the symptoms worsen or if the condition fails to improve as anticipated.  I provided 10 minutes of non-face-to-face time during this encounter including median intraservice time, reviewing previous notes, labs, imaging, medications and explaining diagnosis and management.  , FNP-BC

## 2021-01-08 ENCOUNTER — Telehealth: Payer: Self-pay | Admitting: Nurse Practitioner

## 2021-01-08 NOTE — Telephone Encounter (Signed)
[phone] Pt is calling because she did have the first shingles vaccine. But there is no record of the second vaccine scheduling. Pt would like to know if she can get it when she comes in for her 02/12/21 ov? Or does she need to come in sooner?

## 2021-01-09 NOTE — Telephone Encounter (Signed)
Pt should receive the 2nd dose 2-6 months after the 1st dose. Therefore, she can get the second dose during her December appointment.

## 2021-01-09 NOTE — Telephone Encounter (Signed)
Called pt left VM next shot is due in December. Name and contact info provided

## 2021-01-12 ENCOUNTER — Telehealth: Payer: Self-pay | Admitting: Nurse Practitioner

## 2021-01-12 NOTE — Telephone Encounter (Signed)
Called the patient. She is telling me the abd pain is about the same. The Gas X has helped the gas to be less.She has pain when she wakes up which is usually about 4:30 am. She does not get up but waits. She can feel the abdomen be uncomfortable when she is hungry, when she eats or drinks her coffee.   She is taking the Melatonin (she calls it the supplement). She states she thinks it is stress and sadness. She is going to talk with Z. Meredeth Ide NP about mental health counselor.  She thanks you for listening to her.

## 2021-01-16 ENCOUNTER — Other Ambulatory Visit: Payer: Self-pay

## 2021-01-16 DIAGNOSIS — R634 Abnormal weight loss: Secondary | ICD-10-CM

## 2021-01-16 DIAGNOSIS — R109 Unspecified abdominal pain: Secondary | ICD-10-CM

## 2021-01-16 NOTE — Telephone Encounter (Signed)
Spoke with the patient. She is agreeable to having a CT of the abd/pelvis. With contrast. Arranged for [facility] location on 01/23/21 arriving at 8:45 am for a 9:00 am appointment. The patient will come to me tomorrow to have her instructions and oral contrast given to her. She will then go to the lab for a BMET.

## 2021-01-17 ENCOUNTER — Other Ambulatory Visit (INDEPENDENT_AMBULATORY_CARE_PROVIDER_SITE_OTHER): Payer: Self-pay

## 2021-01-17 DIAGNOSIS — R634 Abnormal weight loss: Secondary | ICD-10-CM

## 2021-01-17 DIAGNOSIS — R109 Unspecified abdominal pain: Secondary | ICD-10-CM

## 2021-01-17 LAB — BASIC METABOLIC PANEL
BUN: 19 mg/dL (ref 6–23)
CO2: 32 mEq/L (ref 19–32)
Calcium: 9.1 mg/dL (ref 8.4–10.5)
Chloride: 100 mEq/L (ref 96–112)
Creatinine, Ser: 1.06 mg/dL (ref 0.40–1.20)
GFR: 51.19 mL/min — ABNORMAL LOW (ref >60.00)
Glucose, Bld: 135 mg/dL — ABNORMAL HIGH (ref 70–99)
Potassium: 3.6 mEq/L (ref 3.5–5.1)
Sodium: 138 mEq/L (ref 135–145)

## 2021-01-19 ENCOUNTER — Telehealth: Payer: No Typology Code available for payment source | Admitting: Nurse Practitioner

## 2021-01-19 NOTE — Telephone Encounter (Signed)
Called and spoke to pt and make her a nurse visit for Monday

## 2021-01-19 NOTE — Telephone Encounter (Signed)
Copied from CRM [id number]. Topic: General - Other >> Jan 17, 2021  3:38 PM Jaquita Rector A wrote: Reason for CRM: Patient want a call back from Bertram Denver to say if she can come in and get the 2nd Shingles vaccine before 02/12/21. Please call Ph# [phone]   Can this patient get the 2nd shingle shot before 12/12. Please let me know and I can call to schedule

## 2021-01-19 NOTE — Telephone Encounter (Signed)
Copied from CRM [id number]. Topic: General - Other >> Jan 17, 2021  3:38 PM Jaquita Rector A wrote: Reason for CRM: Patient want a call back from Bertram Denver to say if she can come in and get the 2nd Shingles vaccine before 02/12/21. Please call Ph# [phone]

## 2021-01-19 NOTE — Telephone Encounter (Signed)
Attempt to call patient to schedule appt for 2nd shingles vaccine. Call was disconnected.   Called back and left voicemail to return call.

## 2021-01-20 ENCOUNTER — Encounter: Payer: Self-pay | Admitting: Nurse Practitioner

## 2021-01-22 ENCOUNTER — Other Ambulatory Visit: Payer: Self-pay

## 2021-01-22 ENCOUNTER — Ambulatory Visit: Payer: Self-pay | Attending: Nurse Practitioner

## 2021-01-22 ENCOUNTER — Telehealth: Payer: Self-pay | Admitting: Nurse Practitioner

## 2021-01-22 NOTE — Telephone Encounter (Signed)
I return Pt call, she was inform that she is approve for the Financial program with a 100% and I will send her a copy of the letter to her

## 2021-01-22 NOTE — Telephone Encounter (Signed)
Copied from CRM [id number]. Topic: General - Other >> Jan 22, 2021  8:53 AM Jaquita Rector A wrote: Reason for CRM: Patient called in needing to speak to Mikle Bosworth say that she have an emergency and need to speak to him can be reached at  Ph# [phone]

## 2021-01-23 ENCOUNTER — Encounter (HOSPITAL_BASED_OUTPATIENT_CLINIC_OR_DEPARTMENT_OTHER): Payer: Self-pay

## 2021-01-23 ENCOUNTER — Ambulatory Visit (HOSPITAL_BASED_OUTPATIENT_CLINIC_OR_DEPARTMENT_OTHER)
Admission: RE | Admit: 2021-01-23 | Discharge: 2021-01-23 | Disposition: A | Discharge status: Home / Self Care | Payer: Self-pay | Source: Ambulatory Visit | Attending: Nurse Practitioner | Admitting: Nurse Practitioner

## 2021-01-23 DIAGNOSIS — R634 Abnormal weight loss: Secondary | ICD-10-CM | POA: Insufficient documentation

## 2021-01-23 DIAGNOSIS — R109 Unspecified abdominal pain: Secondary | ICD-10-CM | POA: Insufficient documentation

## 2021-01-23 MED ORDER — IOHEXOL 300 MG/ML  SOLN
60.0000 mL | Freq: Once | INTRAMUSCULAR | Status: AC | PRN
  Administered 2021-01-23: 60 mL via INTRAVENOUS

## 2021-01-23 NOTE — Telephone Encounter (Signed)
Tele Appt made for pt to talk with provider about medication

## 2021-01-24 ENCOUNTER — Other Ambulatory Visit: Payer: Self-pay

## 2021-01-24 ENCOUNTER — Encounter: Payer: Self-pay | Admitting: Nurse Practitioner

## 2021-01-24 ENCOUNTER — Ambulatory Visit: Payer: Self-pay | Attending: Nurse Practitioner | Admitting: Nurse Practitioner

## 2021-01-24 DIAGNOSIS — F418 Other specified anxiety disorders: Secondary | ICD-10-CM

## 2021-01-24 DIAGNOSIS — F5105 Insomnia due to other mental disorder: Secondary | ICD-10-CM

## 2021-01-24 MED ORDER — ZOLPIDEM TARTRATE 5 MG PO TABS
5.0000 mg | ORAL_TABLET | Freq: Every evening | ORAL | 0 refills | Status: DC | PRN
  Filled 2021-01-24: qty 30, 30d supply, fill #0

## 2021-01-24 MED ORDER — CITALOPRAM HYDROBROMIDE 40 MG PO TABS
ORAL_TABLET | Freq: Every day | ORAL | 3 refills | Status: AC
  Filled 2021-01-24: qty 90, 90d supply, fill #0

## 2021-01-24 MED ORDER — ZOLPIDEM TARTRATE 5 MG PO TABS: 5.0000 mg | ORAL_TABLET | Freq: Every evening | ORAL | 0 refills | Status: DC | PRN

## 2021-01-24 NOTE — Progress Notes (Signed)
Virtual Visit via Telephone Note Due to national recommendations of social distancing due to COVID 19, telehealth visit is felt to be most appropriate for this patient at this time.  I discussed the limitations, risks, security and privacy concerns of performing an evaluation and management service by telephone and the availability of in person appointments. I also discussed with the patient that there may be a patient responsible charge related to this service. The patient expressed understanding and agreed to proceed.    I connected with Taniah Gelila Well on 01/24/21  at   4:10 PM EST  EDT by telephone and verified that I am speaking with the correct person using two identifiers.  Location of Patient: Private Residence   [facility] Provider[facility]    Persons participating in Telemedicine visit: [name] Felma Roslynn Amble    History of Present Illness: Telemedicine visit for: Insomnia with anxiety and Depression  Currently taking Celexa 40mg  daily. Could not take hydroxyzine as it made her extremely drowsy and fatigued. She has tried melatonin up to 6 mg to help her sleep which was ineffective.  Now only getting 2-3 hours of sleep. Requested ativan which was denied by me. She would like to try a low dose of ambien to help her sleep.     Past Medical History:  Diagnosis Date   Coronary artery disease    GERD (gastroesophageal reflux disease)    High blood cholesterol    Hypertension     Past Surgical History:  Procedure Laterality Date   CESAREAN SECTION     COLONOSCOPY N/A 01/06/2013   Procedure: COLONOSCOPY;  Surgeon: 13/07/2012, MD;  Location[facility] ENDOSCOPY;  Service: Endoscopy;  Laterality: N/A;   ESOPHAGOGASTRODUODENOSCOPY N/A 01/06/2013   Procedure: ESOPHAGOGASTRODUODENOSCOPY (EGD);  Surgeon: 13/07/2012, MD;  Location: [facility] ENDOSCOPY;  Service: Endoscopy;  Laterality: N/A;   GALLBLADDER SURGERY      Family History  Problem  Relation Age of Onset   Breast cancer Sister        older sister    Diabetes Sister    Prostate cancer Brother        older brother    Diabetes Brother    Colon cancer Neg Hx    Stomach cancer Neg Hx    Pancreatic cancer Neg Hx    Esophageal cancer Neg Hx     Social History   Socioeconomic History   Marital status: Married    Spouse name: Not on file   Number of children: Not on file   Years of education: Not on file   Highest education level: Not on file  Occupational History   Not on file  Tobacco Use   Smoking status: Never   Smokeless tobacco: Never  Vaping Use   Vaping Use: Never used  Substance and Sexual Activity   Alcohol use: No   Drug use: No   Sexual activity: Yes    Partners: Female, Female  Other Topics Concern   Not on file  Social History Narrative   Not on file   Social Determinants of Health   Financial Resource Strain: Not on file  Food Insecurity: Not on file  Transportation Needs: Not on file  Physical Activity: Not on file  Stress: Not on file  Social Connections: Not on file     Observations/Objective: Awake, alert and oriented x 3   Review of Systems  Constitutional:  Negative for fever, malaise/fatigue and weight loss.  HENT: Negative.  Negative for nosebleeds.   Eyes: Negative.  Negative for blurred vision, double vision and photophobia.  Respiratory: Negative.  Negative for cough and shortness of breath.   Cardiovascular: Negative.  Negative for chest pain, palpitations and leg swelling.  Gastrointestinal: Negative.  Negative for heartburn, nausea and vomiting.  Musculoskeletal: Negative.  Negative for myalgias.  Neurological: Negative.  Negative for dizziness, focal weakness, seizures and headaches.  Psychiatric/Behavioral:  Positive for depression. Negative for suicidal ideas. The patient is nervous/anxious and has insomnia.    Assessment and Plan: Diagnoses and all orders for this visit:  Insomnia secondary to depression with  anxiety -     Discontinue: zolpidem (AMBIEN) 5 MG tablet; Take 1 tablet (5 mg total) by mouth at bedtime as needed for sleep. -     citalopram (CELEXA) 40 MG tablet; TAKE 1 TABLET (40 MG TOTAL) BY MOUTH DAILY. -     zolpidem (AMBIEN) 5 MG tablet; Take 1 tablet (5 mg total) by mouth at bedtime as needed for sleep.    Follow Up Instructions Return in about 2 weeks (around 02/07/2021) for Tele on Tuesday for insomnia. .     I discussed the assessment and treatment plan with the patient. The patient was provided an opportunity to ask questions and all were answered. The patient agreed with the plan and demonstrated an understanding of the instructions.   The patient was advised to call back or seek an in-person evaluation if the symptoms worsen or if the condition fails to improve as anticipated.  I provided 10 minutes of non-face-to-face time during this encounter including median intraservice time, reviewing previous notes, labs, imaging, medications and explaining diagnosis and management.  Claiborne Rigg, FNP-BC

## 2021-01-29 ENCOUNTER — Ambulatory Visit: Payer: No Typology Code available for payment source | Admitting: Registered"

## 2021-01-29 ENCOUNTER — Other Ambulatory Visit: Payer: Self-pay

## 2021-01-30 ENCOUNTER — Telehealth: Payer: Self-pay | Admitting: Nurse Practitioner

## 2021-01-30 ENCOUNTER — Telehealth: Payer: No Typology Code available for payment source | Admitting: Nurse Practitioner

## 2021-01-30 NOTE — Telephone Encounter (Signed)
Spoke with the patient. She is experiencing a lot of intestinal gas. Has a bowel movement every other day to every 3 days. Complains of pressure in her abdomen. She is willing to take a stool softener tonight. She states she does not drink a lot of water because it makes her feel more pressure and full.  Do you want to see her back again?

## 2021-01-30 NOTE — Telephone Encounter (Signed)
Inbound call from patient states that she has some questions about her results. Requesting that you call her back. Please advise.

## 2021-02-02 ENCOUNTER — Other Ambulatory Visit: Payer: Self-pay

## 2021-02-02 ENCOUNTER — Telehealth: Payer: Self-pay | Admitting: Nurse Practitioner

## 2021-02-02 NOTE — Telephone Encounter (Signed)
Copied from CRM [id number]. Topic: General - Other >> Feb 02, 2021 11:20 AM Linda Hughes wrote: Reason for CRM: Patient called in to inform Linda Hughes that she is needing to have Hughes 90 day refill on mirabegron ER (MYRBETRIQ) 25 MG TB24 tablet but will need it for 3 months so she can travel to her country to be with her husband. To get medication free Please call [phone] in order to get the medication. Please call patient at Ph# [phone]

## 2021-02-02 NOTE — Telephone Encounter (Signed)
Spoke with pt and she is aware of Paula's recommendations. She also wanted to know if she can drink a soda to help belch, discussed with her that it fine.

## 2021-02-02 NOTE — Telephone Encounter (Signed)
Pt must contact the provider to get refills. Pt has refills available at her pharmacy.

## 2021-02-07 ENCOUNTER — Other Ambulatory Visit: Payer: Self-pay | Admitting: Nurse Practitioner

## 2021-02-07 ENCOUNTER — Telehealth: Payer: Self-pay

## 2021-02-07 ENCOUNTER — Other Ambulatory Visit: Payer: Self-pay

## 2021-02-07 NOTE — Telephone Encounter (Signed)
I spoke to Linda Hughes on the phone this morning and she is leaving for [place] next week to see her husband who she hasn't seen in 3 years due to COVID.  She is requesting a 3 month supply(possibly early refill) of Ambien?  She requested that I forward this message, that the Ambien is working for her.  She has been able to sleep.

## 2021-02-07 NOTE — Telephone Encounter (Signed)
I can only fill a 30 day supply as it is a controlled substance. She will have to check to see if she can get this in [place] once she gets there after 30 days. THANKS!

## 2021-02-12 ENCOUNTER — Ambulatory Visit: Payer: No Typology Code available for payment source | Admitting: Nurse Practitioner

## 2021-02-13 ENCOUNTER — Encounter: Payer: Self-pay | Admitting: Nurse Practitioner

## 2021-02-13 ENCOUNTER — Telehealth (HOSPITAL_BASED_OUTPATIENT_CLINIC_OR_DEPARTMENT_OTHER): Payer: No Typology Code available for payment source | Admitting: Nurse Practitioner

## 2021-02-13 DIAGNOSIS — K5909 Other constipation: Secondary | ICD-10-CM

## 2021-02-13 MED ORDER — SENNOSIDES-DOCUSATE SODIUM 8.6-50 MG PO TABS: 2.0000 | ORAL_TABLET | Freq: Two times a day (BID) | ORAL | 3 refills | Status: AC

## 2021-02-13 NOTE — Progress Notes (Signed)
Virtual Visit via Telephone Note Due to national recommendations of social distancing due to COVID 19, telehealth visit is felt to be most appropriate for this patient at this time.  I discussed the limitations, risks, security and privacy concerns of performing an evaluation and management service by telephone and the availability of in person appointments. I also discussed with the patient that there may be a patient responsible charge related to this service. The patient expressed understanding and agreed to proceed.    I connected with Linda Hughes on 02/13/21  at  10:30 AM EST  EDT by telephone and verified that I am speaking with the correct person using two identifiers.  Location of Patient: Private Residence   [facility] Provider: [facility]    Persons participating in Telemedicine visit: [name] [name]    History of Present Illness: Telemedicine visit for: Chronic Constipation  Stool pattern has been 1 formed and pellet like stool(s) per week. Onset was several months ago Defecation has been difficult and incomplete. Co-Morbid conditions:culprit medication: myrbetriq, calcium and stress. Symptoms have been gradually worsening. Current Health Habits: Eating fiber? yes Exercise?yes Water intake? Yes but not sufficien Current OTC/RX therapy has been  MOM  which has been ineffective.  Associated symptoms include; abdominal distension, pressure and excessive gas (Taking gas-x)   Insomnia States only getting 4-5 hours of sleep even with taking ambien. Wants to know if this can be increased. I have instructed her it is not recommended to increase ambien dosage beyond 5 mg based on gender and age.    Past Medical History:  Diagnosis Date   Coronary artery disease    GERD (gastroesophageal reflux disease)    High blood cholesterol    Hypertension     Past Surgical History:  Procedure Laterality Date   CESAREAN SECTION      COLONOSCOPY N/A 01/06/2013   Procedure: COLONOSCOPY;  Surgeon: Barrie Folk, MD;  Location: [facility] ENDOSCOPY;  Service: Endoscopy;  Laterality: N/A;   ESOPHAGOGASTRODUODENOSCOPY N/A 01/06/2013   Procedure: ESOPHAGOGASTRODUODENOSCOPY (EGD);  Surgeon: Barrie Folk, MD;  Location: [facility] ENDOSCOPY;  Service: Endoscopy;  Laterality: N/A;   GALLBLADDER SURGERY      Family History  Problem Relation Age of Onset   Breast cancer Sister        older sister    Diabetes Sister    Prostate cancer Brother        older brother    Diabetes Brother    Colon cancer Neg Hx    Stomach cancer Neg Hx    Pancreatic cancer Neg Hx    Esophageal cancer Neg Hx     Social History   Socioeconomic History   Marital status: Married    Spouse name: Not on file   Number of children: Not on file   Years of education: Not on file   Highest education level: Not on file  Occupational History   Not on file  Tobacco Use   Smoking status: Never   Smokeless tobacco: Never  Vaping Use   Vaping Use: Never used  Substance and Sexual Activity   Alcohol use: No   Drug use: No   Sexual activity: Yes    Partners: Female, Female  Other Topics Concern   Not on file  Social History Narrative   Not on file   Social Determinants of Health   Financial Resource Strain: Not on file  Food Insecurity: Not on file  Transportation  Needs: Not on file  Physical Activity: Not on file  Stress: Not on file  Social Connections: Not on file     Observations/Objective: Awake, alert and oriented x 3   Review of Systems  Constitutional:  Negative for fever, malaise/fatigue and weight loss.  HENT: Negative.  Negative for nosebleeds.   Eyes: Negative.  Negative for blurred vision, double vision and photophobia.  Respiratory: Negative.  Negative for cough and shortness of breath.   Cardiovascular: Negative.  Negative for chest pain, palpitations and leg swelling.  Gastrointestinal:  Positive for constipation. Negative for abdominal  pain, blood in stool, diarrhea, heartburn, melena, nausea and vomiting.  Musculoskeletal: Negative.  Negative for myalgias.  Neurological: Negative.  Negative for dizziness, focal weakness, seizures and headaches.  Psychiatric/Behavioral:  Negative for suicidal ideas. The patient is nervous/anxious and has insomnia.    Assessment and Plan: Diagnoses and all orders for this visit:  Chronic constipation -     senna-docusate (SENOKOT-S) 8.6-50 MG tablet; Take 2 tablets by mouth 2 (two) times daily. Decrease to 2 tablets daily if loose stools develop  Increase water intake to 60 oz per day She can try an over the counter enema for immediate relief Also can try smooth move tea for immediate relief   Follow Up Instructions Return if symptoms worsen or fail to improve.     I discussed the assessment and treatment plan with the patient. The patient was provided an opportunity to ask questions and all were answered. The patient agreed with the plan and demonstrated an understanding of the instructions.   The patient was advised to call back or seek an in-person evaluation if the symptoms worsen or if the condition fails to improve as anticipated.  I provided 10 minutes of non-face-to-face time during this encounter including median intraservice time, reviewing previous notes, labs, imaging, medications and explaining diagnosis and management.  Claiborne Rigg, FNP-BC

## 2021-02-15 ENCOUNTER — Other Ambulatory Visit: Payer: Self-pay | Admitting: Nurse Practitioner

## 2021-02-15 ENCOUNTER — Other Ambulatory Visit: Payer: Self-pay

## 2021-02-15 DIAGNOSIS — F5105 Insomnia due to other mental disorder: Secondary | ICD-10-CM

## 2021-02-16 ENCOUNTER — Other Ambulatory Visit: Payer: Self-pay

## 2021-02-16 ENCOUNTER — Other Ambulatory Visit: Payer: Self-pay | Admitting: Nurse Practitioner

## 2021-02-16 DIAGNOSIS — F5105 Insomnia due to other mental disorder: Secondary | ICD-10-CM

## 2021-02-16 DIAGNOSIS — F418 Other specified anxiety disorders: Secondary | ICD-10-CM

## 2021-02-16 MED ORDER — ZOLPIDEM TARTRATE 5 MG PO TABS: 5.0000 mg | ORAL_TABLET | Freq: Every evening | ORAL | 0 refills | Status: AC | PRN

## 2021-02-16 NOTE — Telephone Encounter (Signed)
Pt was following up on this refill request, pt stated she ran out of this medication.

## 2021-02-16 NOTE — Telephone Encounter (Signed)
Requested medication (s) are due for refill today: filled 01/24/21  Requested medication (s) are on the active medication list: yes  Last refill:  01/24/21  Future visit scheduled: no, seen 01/24/21 and 02/13/21  Notes to clinic:  This medication can not be delegated, please assess.    Requested Prescriptions  Pending Prescriptions Disp Refills   zolpidem (AMBIEN) 5 MG tablet [Pharmacy Med Name: ZOLPIDEM TARTRATE 5 MG TABLET] 30 tablet 0    Sig: TAKE 1 TABLET BY MOUTH AT BEDTIME AS NEEDED FOR SLEEP.     Not Delegated - Psychiatry:  Anxiolytics/Hypnotics Failed - 02/15/2021 12:25 PM      Failed - This refill cannot be delegated      Failed - Urine Drug Screen completed in last 360 days      Passed - Valid encounter within last 6 months    Recent Outpatient Visits           3 days ago Chronic constipation   [facility] [place], [place] W, NP   3 weeks ago Insomnia secondary to depression with anxiety   [facility] [place], Shea Stakes, NP   1 month ago Anxiety and depression   [place] [address] [facility] [place], Shea Stakes, NP   1 month ago Constipation, unspecified constipation type   [facility] [place], Cari S, PA-C   3 months ago Essential hypertension   [facility] [place], Shea Stakes, NP

## 2021-08-10 ENCOUNTER — Other Ambulatory Visit: Payer: Self-pay
